# Patient Record
Sex: Male | Born: 1970 | Race: White | Hispanic: No | Marital: Married | Smoking: Never smoker
Health system: Southern US, Community
[De-identification: ages and names within clinical notes are randomized; demographics above are authoritative.]

## PROBLEM LIST (undated history)

## (undated) DIAGNOSIS — K5730 Diverticulosis of large intestine without perforation or abscess without bleeding: Secondary | ICD-10-CM

## (undated) DIAGNOSIS — Z860101 Personal history of adenomatous and serrated colon polyps: Secondary | ICD-10-CM

## (undated) DIAGNOSIS — F419 Anxiety disorder, unspecified: Secondary | ICD-10-CM

## (undated) DIAGNOSIS — R202 Paresthesia of skin: Secondary | ICD-10-CM

## (undated) DIAGNOSIS — Z8489 Family history of other specified conditions: Secondary | ICD-10-CM

## (undated) DIAGNOSIS — K219 Gastro-esophageal reflux disease without esophagitis: Secondary | ICD-10-CM

## (undated) DIAGNOSIS — Z83719 Family history of colon polyps, unspecified: Secondary | ICD-10-CM

## (undated) DIAGNOSIS — Z800 Family history of malignant neoplasm of digestive organs: Secondary | ICD-10-CM

## (undated) DIAGNOSIS — I2510 Atherosclerotic heart disease of native coronary artery without angina pectoris: Secondary | ICD-10-CM

## (undated) DIAGNOSIS — N529 Male erectile dysfunction, unspecified: Secondary | ICD-10-CM

## (undated) HISTORY — DX: Family history of colon polyps, unspecified: Z83.719

## (undated) HISTORY — DX: Gastro-esophageal reflux disease without esophagitis: K21.9

## (undated) HISTORY — DX: Male erectile dysfunction, unspecified: N52.9

## (undated) HISTORY — DX: Personal history of adenomatous and serrated colon polyps: Z86.0101

## (undated) HISTORY — DX: Family history of malignant neoplasm of digestive organs: Z80.0

## (undated) HISTORY — PX: NO PAST SURGERIES: SHX2092

## (undated) HISTORY — DX: Diverticulosis of large intestine without perforation or abscess without bleeding: K57.30

## (undated) HISTORY — DX: Atherosclerotic heart disease of native coronary artery without angina pectoris: I25.10

## (undated) HISTORY — DX: Anxiety disorder, unspecified: F41.9

## (undated) HISTORY — DX: Paresthesia of skin: R20.2

---

## 2005-07-17 ENCOUNTER — Ambulatory Visit: Payer: Self-pay | Admitting: Gastroenterology

## 2018-02-24 DIAGNOSIS — J018 Other acute sinusitis: Secondary | ICD-10-CM | POA: Diagnosis not present

## 2018-05-23 DIAGNOSIS — M545 Low back pain: Secondary | ICD-10-CM | POA: Diagnosis not present

## 2018-06-24 DIAGNOSIS — J301 Allergic rhinitis due to pollen: Secondary | ICD-10-CM | POA: Diagnosis not present

## 2018-06-24 DIAGNOSIS — Z0000 Encounter for general adult medical examination without abnormal findings: Secondary | ICD-10-CM | POA: Diagnosis not present

## 2018-06-24 DIAGNOSIS — K219 Gastro-esophageal reflux disease without esophagitis: Secondary | ICD-10-CM | POA: Diagnosis not present

## 2018-06-24 DIAGNOSIS — Z125 Encounter for screening for malignant neoplasm of prostate: Secondary | ICD-10-CM | POA: Diagnosis not present

## 2018-09-29 DIAGNOSIS — R202 Paresthesia of skin: Secondary | ICD-10-CM | POA: Diagnosis not present

## 2018-09-29 DIAGNOSIS — M549 Dorsalgia, unspecified: Secondary | ICD-10-CM | POA: Diagnosis not present

## 2018-10-04 ENCOUNTER — Other Ambulatory Visit: Payer: Self-pay | Admitting: Internal Medicine

## 2018-10-04 DIAGNOSIS — R202 Paresthesia of skin: Secondary | ICD-10-CM

## 2018-10-05 ENCOUNTER — Other Ambulatory Visit: Payer: Self-pay | Admitting: Internal Medicine

## 2018-10-05 DIAGNOSIS — R202 Paresthesia of skin: Secondary | ICD-10-CM

## 2018-10-15 ENCOUNTER — Ambulatory Visit
Admission: RE | Admit: 2018-10-15 | Discharge: 2018-10-15 | Disposition: A | Discharge status: Home / Self Care | Payer: Self-pay | Source: Ambulatory Visit | Attending: Internal Medicine | Admitting: Internal Medicine

## 2018-10-15 DIAGNOSIS — R202 Paresthesia of skin: Secondary | ICD-10-CM

## 2018-10-15 DIAGNOSIS — M542 Cervicalgia: Secondary | ICD-10-CM | POA: Diagnosis not present

## 2018-10-20 DIAGNOSIS — M5412 Radiculopathy, cervical region: Secondary | ICD-10-CM | POA: Diagnosis not present

## 2020-01-01 ENCOUNTER — Ambulatory Visit: Payer: 59 | Attending: Internal Medicine

## 2020-01-01 DIAGNOSIS — Z20822 Contact with and (suspected) exposure to covid-19: Secondary | ICD-10-CM

## 2020-01-02 LAB — NOVEL CORONAVIRUS, NAA: SARS-CoV-2, NAA: DETECTED — AB

## 2020-02-02 ENCOUNTER — Ambulatory Visit: Payer: 59 | Attending: Internal Medicine

## 2020-02-02 DIAGNOSIS — Z23 Encounter for immunization: Secondary | ICD-10-CM

## 2020-02-02 NOTE — Progress Notes (Signed)
   Covid-19 Vaccination Clinic  Name:  Donald Ramos    MRN: [id number] DOB: 1971/05/22  02/02/2020  [name] was observed post Covid-19 immunization for 15 minutes without incident. He was provided with Vaccine Information Sheet and instruction to access the V-Safe system.   [name] was instructed to call 911 with any severe reactions post vaccine: Marland Kitchen Difficulty breathing  . Swelling of face and throat  . A fast heartbeat  . A bad rash all over body  . Dizziness and weakness   Immunizations Administered    Name Date Dose VIS Date Route   Pfizer COVID-19 Vaccine 02/02/2020 11:36 AM 0.3 mL 10/27/2019 Intramuscular   Manufacturer: [employer], [employer]   Lot: [id number]   NDC: [address]-4

## 2020-02-27 ENCOUNTER — Ambulatory Visit: Payer: 59 | Attending: Internal Medicine

## 2020-02-27 DIAGNOSIS — Z23 Encounter for immunization: Secondary | ICD-10-CM

## 2020-02-27 NOTE — Progress Notes (Signed)
   Covid-19 Vaccination Clinic  Name:  Donald Ramos    MRN: [id number] DOB: [birth year]  02/27/2020  [name] was observed post Covid-19 immunization for 15 minutes without incident. He was provided with Vaccine Information Sheet and instruction to access the V-Safe system.   [name] was instructed to call 911 with any severe reactions post vaccine: Marland Kitchen Difficulty breathing  . Swelling of face and throat  . A fast heartbeat  . A bad rash all over body  . Dizziness and weakness   Immunizations Administered    Name Date Dose VIS Date Route   Pfizer COVID-19 Vaccine 02/27/2020 12:08 PM 0.3 mL 10/27/2019 Intramuscular   Manufacturer: [employer], [employer]   Lot: [id number]   NDC: [address]-7

## 2020-05-23 ENCOUNTER — Ambulatory Visit: Payer: Self-pay

## 2020-05-23 ENCOUNTER — Other Ambulatory Visit: Payer: Self-pay

## 2020-05-23 ENCOUNTER — Encounter: Payer: Self-pay | Admitting: Surgery

## 2020-05-23 ENCOUNTER — Ambulatory Visit (INDEPENDENT_AMBULATORY_CARE_PROVIDER_SITE_OTHER): Payer: 59 | Admitting: Surgery

## 2020-05-23 DIAGNOSIS — M25562 Pain in left knee: Secondary | ICD-10-CM | POA: Diagnosis not present

## 2020-05-23 DIAGNOSIS — G8929 Other chronic pain: Secondary | ICD-10-CM

## 2020-05-23 MED ORDER — LIDOCAINE HCL 1 % IJ SOLN
3.0000 mL | INTRAMUSCULAR | Status: AC | PRN
  Administered 2020-05-23: 3 mL

## 2020-05-23 MED ORDER — METHYLPREDNISOLONE ACETATE 40 MG/ML IJ SUSP
80.0000 mg | INTRAMUSCULAR | Status: AC | PRN
  Administered 2020-05-23: 80 mg via INTRA_ARTICULAR

## 2020-05-23 MED ORDER — BUPIVACAINE HCL 0.25 % IJ SOLN
6.0000 mL | INTRAMUSCULAR | Status: AC | PRN
  Administered 2020-05-23: 6 mL via INTRA_ARTICULAR

## 2020-05-23 NOTE — Progress Notes (Signed)
Office Visit Note   Patient: Donald Ramos           Date of Birth: 1971-04-09           MRN: [id number] Visit Date: 05/23/2020              Requested by: Kirby Funk, MD [address] [place],  [place] [address] PCP: Kirby Funk, MD   Assessment & Plan: Visit Diagnoses:  1. Chronic pain of left knee   2. Mechanical knee pain, left     Plan: In hopes of giving patient relief of his knee pain offered conservative treatment with injection.  After patient consent left knee was prepped with Betadine and intra-articular Marcaine/Depo-Medrol injection was performed.  Tolerated procedure well without complication.  I asked patient to avoid any strenuous activity including his jujitsu training for at least 2 days.  Can resume activity after that.  He will follow-up with me in 2 weeks for recheck and if he has not had any improvement I will plan to schedule left knee MRI to rule out medial and lateral meniscal tear.  I do see patient routinely outside of the office and I will continue to monitor his progress before next office visit.  All questions answered.  Follow-Up Instructions: Return in about 2 weeks (around 06/06/2020) for with [facility] recheck .   Orders:  Orders Placed This Encounter  Procedures  . XR KNEE 3 VIEW LEFT   No orders of the defined types were placed in this encounter.     Procedures: Large Joint Inj on 05/23/2020 11:34 AM Indications: pain Details: 25 G 1.5 in needle, anteromedial approach Medications: 3 mL lidocaine 1 %; 6 mL bupivacaine 0.25 %; 80 mg methylPREDNISolone acetate 40 MG/ML Consent was given by the patient. Patient was prepped and draped in the usual sterile fashion.       Clinical Data: No additional findings.   Subjective: Chief Complaint  Patient presents with  . Left Knee - Pain    HPI 49 year old white male who is new patient to clinic comes in with complaints of left knee pain times a few months.  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  States that in March 2021 he was training and trying to make a transition on the ground he pulled his leg and caused a forceful valgus stress to the left knee.  Since then has been having ongoing pain in the knee when training when he is on the ground and his knee is in a flexed position.  Complains of pain at the medial and lateral joint line.  Some feeling of clicking.  No true locking or feeling of instability.  Feels like at times he has limited range of motion with flexion.  Some swelling.  No problems with his knee before initial injury.  Not currently taking any medication. Review of Systems No current cardiac pulmonary GI GU issues  Objective: Vital Signs: There were no vitals taken for this visit.  Physical Exam HENT:     Head: Normocephalic.  Eyes:     Extraocular Movements: Extraocular movements intact.     Pupils: Pupils are equal, round, and reactive to light.  Pulmonary:     Effort: Pulmonary effort is normal. No respiratory distress.  Musculoskeletal:     Comments: Gait is normal.  Negative logroll bilateral hips.  Left knee he has good range of motion.  May be minimal swelling but no palpable  effusion.  Normal tenderness at the lateral joint line.  Mild discomfort lateral compartment with McMurray's testing.  Medial joint line and plica nontender.  Extensor mechanism intact.  Negative patella apprehension.  Cruciate and collateral ligaments are stable.  Patella tendon nontender.  Nontender over the pes bursa.  Right knee unremarkable.  Bilateral calves nontender.  Neurovascular intact.  Skin:    General: Skin is warm and dry.  Neurological:     General: No focal deficit present.     Mental Status: He is alert and oriented to person, place, and time.  Psychiatric:        Mood and Affect: Mood normal.     Ortho Exam  Specialty Comments:  No specialty comments available.  Imaging: No results found.   PMFS  History: There are no problems to display for this patient.  History reviewed. No pertinent past medical history.  History reviewed. No pertinent family history.  History reviewed. No pertinent surgical history. Social History   Occupational History  . Not on file  Tobacco Use  . Smoking status: Not on file  Substance and Sexual Activity  . Alcohol use: Not on file  . Drug use: Not on file  . Sexual activity: Not on file

## 2020-07-02 ENCOUNTER — Telehealth: Payer: Self-pay | Admitting: Surgery

## 2020-07-02 ENCOUNTER — Other Ambulatory Visit: Payer: Self-pay | Admitting: Surgery

## 2020-07-02 DIAGNOSIS — M25562 Pain in left knee: Secondary | ICD-10-CM

## 2020-07-02 NOTE — Telephone Encounter (Signed)
NOTED

## 2020-07-02 NOTE — Telephone Encounter (Signed)
Yesterday I spoke with patient who is well-known to me outside the office regarding his left knee.  Seen by me in the clinic July 2021 and I performed left knee intra-articular Marcaine/Depo-Medrol injection for pain mechanical symptoms.  States that he did not notice any real improvement of his pain with the injection.  He continues have ongoing symptoms with activities.  He is want to proceed with scheduling of left knee MRI to rule out meniscal tear as we had previously discussed.  I will have him follow-up in the office with Dr. Dorene Grebe to discuss results and further treatment options.

## 2020-07-02 NOTE — Telephone Encounter (Signed)
IC patient and he is aware that he will schedule follow up appt with Dr August Saucer to review study. Will you please make sure it is noted in referral as well?

## 2020-07-02 NOTE — Telephone Encounter (Signed)
Please schedule return office visit with Dr. August Saucer to review left knee MRI and discuss treatment options

## 2020-07-10 ENCOUNTER — Telehealth: Payer: Self-pay | Admitting: Orthopedic Surgery

## 2020-07-10 NOTE — Telephone Encounter (Signed)
Called patient left voicemail message to return call to schedule an appointment for MRI review with Dr. August Saucer   MRI scheduled 07/23/2020

## 2020-07-23 ENCOUNTER — Other Ambulatory Visit: Payer: Self-pay

## 2020-07-23 ENCOUNTER — Ambulatory Visit
Admission: RE | Admit: 2020-07-23 | Discharge: 2020-07-23 | Disposition: A | Discharge status: Home / Self Care | Payer: 59 | Source: Ambulatory Visit | Attending: Surgery | Admitting: Surgery

## 2020-07-23 DIAGNOSIS — M25562 Pain in left knee: Secondary | ICD-10-CM

## 2020-07-29 ENCOUNTER — Ambulatory Visit (INDEPENDENT_AMBULATORY_CARE_PROVIDER_SITE_OTHER): Payer: 59 | Admitting: Orthopedic Surgery

## 2020-07-29 DIAGNOSIS — S83512A Sprain of anterior cruciate ligament of left knee, initial encounter: Secondary | ICD-10-CM

## 2020-07-31 ENCOUNTER — Encounter: Payer: Self-pay | Admitting: Orthopedic Surgery

## 2020-07-31 NOTE — Progress Notes (Signed)
Office Visit Note   Patient: Donald Ramos           Date of Birth: 1971-07-24           MRN: [id number] Visit Date: 07/29/2020 Requested by: Kirby Funk, MD [address] [place],  [place] [address] PCP: Kirby Funk, MD  Subjective: Chief Complaint  Patient presents with  . Knee Pain    HPI: Donald Ramos is a 49 year old patient with left knee pain.  He states that he had a twisting injury in February of this year.  Denies any weakness giving way locking or popping.  He is a Programmer, systems.  Most of his pain is lateral.  Deep knee flexion hurts him.  He likes to train fully for [employer] jujitsu.  He did have an injection done 05/23/2020 without much relief.  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   ROS: All systems reviewed are negative as they relate to the chief complaint within the history of present illness.  Patient denies  fevers or chills.   Assessment & Plan: Visit Diagnoses:  1. Sprain of anterior cruciate ligament of left knee, initial encounter     Plan: Impression is left knee pain in a patient who has partial ACL tear and no other meniscal pathology.  I think in general he is fairly asymptomatic with his ACL.  Having some lateral sided symptoms but no real issues there.  No radiographic or clinical evidence of iliotibial band syndrome.  I think Donald Ramos has excellent muscle strength in his legs and as long as he continues to be able to function without symptomatic instability in his knee no indication for surgery.  However I think it is possible and perhaps even likely that he may need surgery in the future based on the type of demands that precision jujitsu places on the leg.  For now he is doing well.  I recommend gradual return to full activity as he can tolerate it but no surgical indication at this time based both on exam as well as his preference.   Follow-up as needed.  Follow-Up Instructions: Return if symptoms worsen or fail to improve.   Orders:  No orders of the defined types were placed in this encounter.  No orders of the defined types were placed in this encounter.     Procedures: No procedures performed   Clinical Data: No additional findings.  Objective: Vital Signs: There were no vitals taken for this visit.  Physical Exam:   Constitutional: Patient appears well-developed HEENT:  Head: Normocephalic Eyes:EOM are normal Neck: Normal range of motion Cardiovascular: Normal rate Pulmonary/chest: Effort normal Neurologic: Patient is alert Skin: Skin is warm Psychiatric: Patient has normal mood and affect    Ortho Exam: Ortho exam demonstrates full active and passive range of motion of left and right knee.  Left knee has a little bit more laxity than the right knee.  Collaterals are stable.  PCL intact bilaterally.  No posterior lateral rotatory instability bilaterally.  On the left-hand side there is mild lateral joint line tenderness but no tenderness over the iliotibial band and lateral epicondyle.  Specialty Comments:  No specialty comments available.  Imaging: No results found.   PMFS History: There are no problems to display for this patient.  History reviewed. No pertinent past medical history.  History reviewed. No pertinent family history.  History reviewed. No pertinent surgical  history. Social History   Occupational History  . Not on file  Tobacco Use  . Smoking status: Not on file  Substance and Sexual Activity  . Alcohol use: Not on file  . Drug use: Not on file  . Sexual activity: Not on file

## 2020-09-26 IMAGING — MR MR KNEE*L* W/O CM
4 of 7 series · 21 of 40 positions shown · non-contrast
Comparison: X-ray 05/23/2020

CLINICAL DATA: Left knee pain for 6 months after twisting injury

EXAM:
MRI OF THE LEFT KNEE WITHOUT CONTRAST
TECHNIQUE: Multiplanar, multisequence MR imaging of the knee was performed. No
intravenous contrast was administered.

[Series 3: T2 fat-sat · axial · 4.0mm · 0.50mm/px · z∈[-97,+33]mm · 4 of 27 slices shown]
[im 1/27]
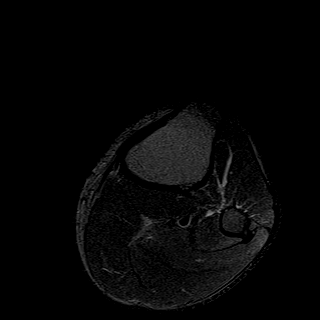
[im 6/27]
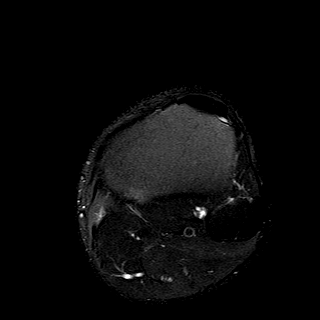
[im 16/27]
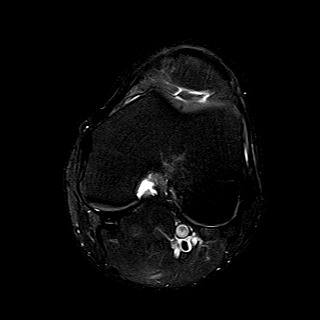
[im 27/27]
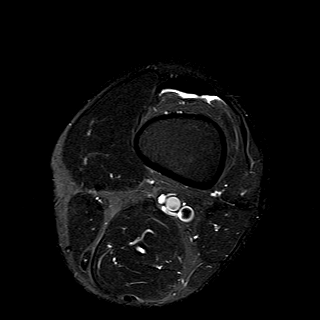

[Series 6: PD fat-sat · coronal · 3.0mm · 0.29mm/px · 7 of 28 slices shown (1 of 3)]
[im 1/28]
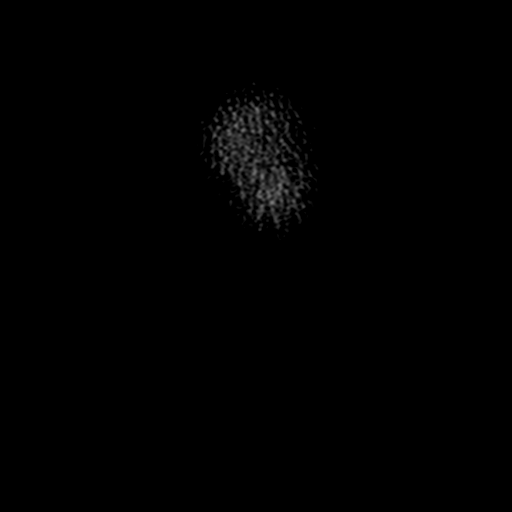
[im 5/28]
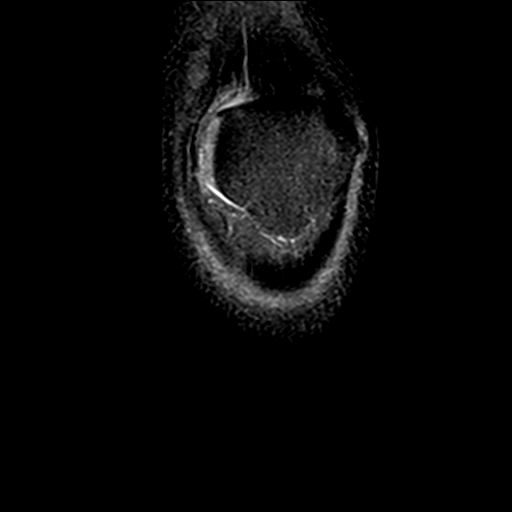
[im 10/28]
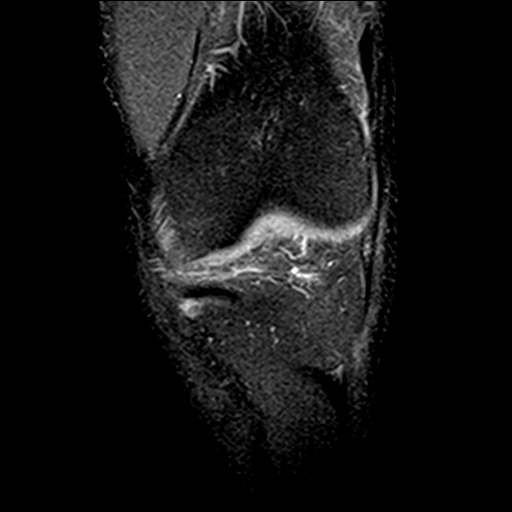
[im 14/28]
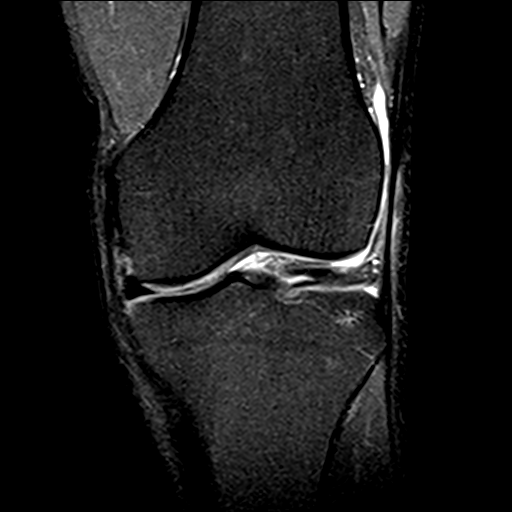
[im 19/28]
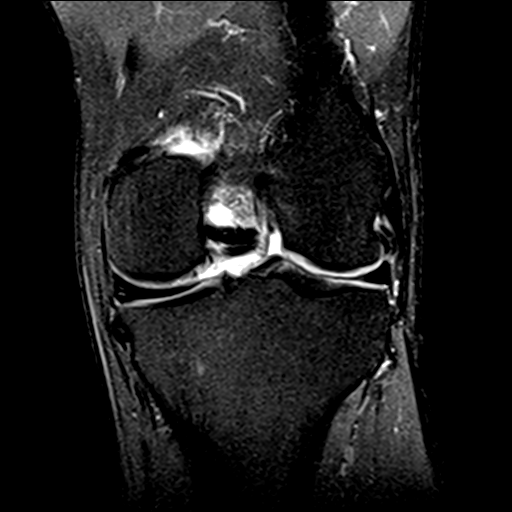
[im 23/28]
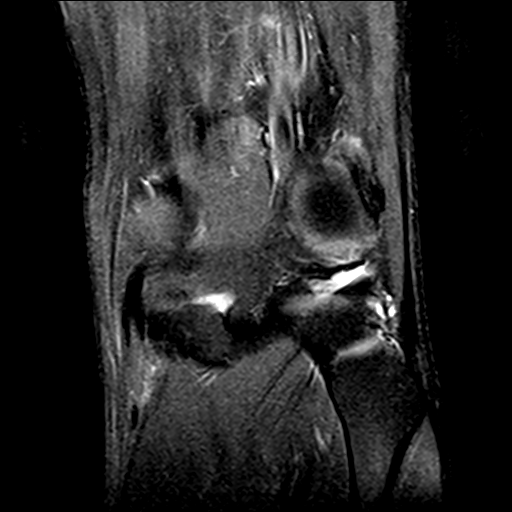
[im 28/28]
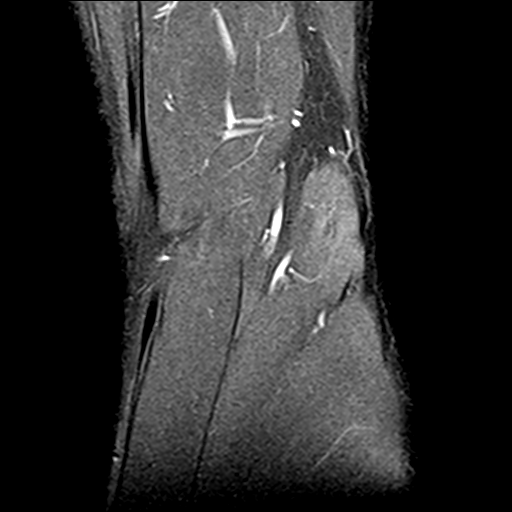

[Series 8: PD fat-sat · sagittal · 3.0mm · 0.29mm/px · 7 of 27 slices shown (2 of 3)]
[im 1/27]
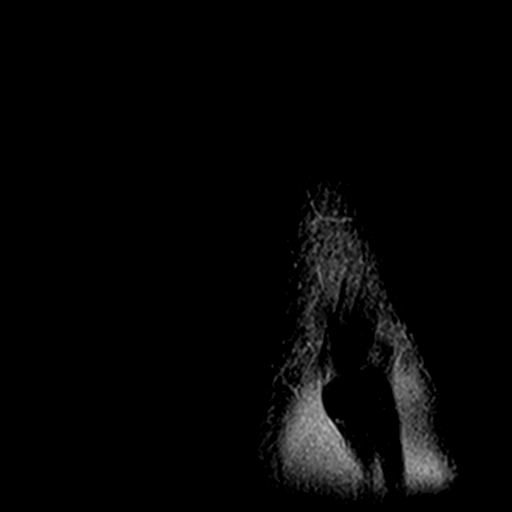
[im 5/27]
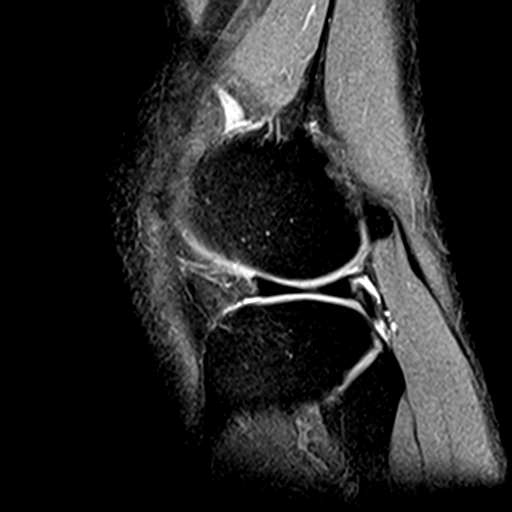
[im 9/27]
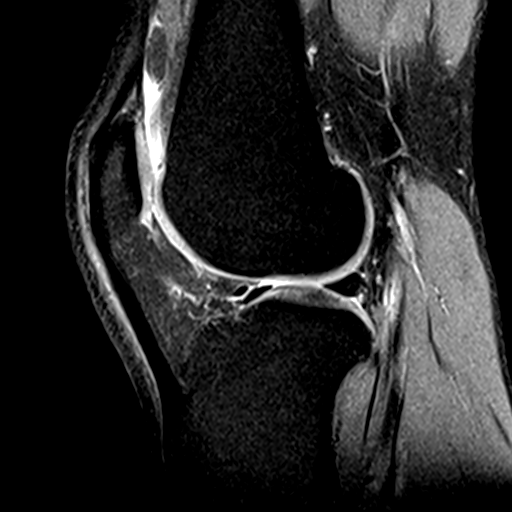
[im 14/27]
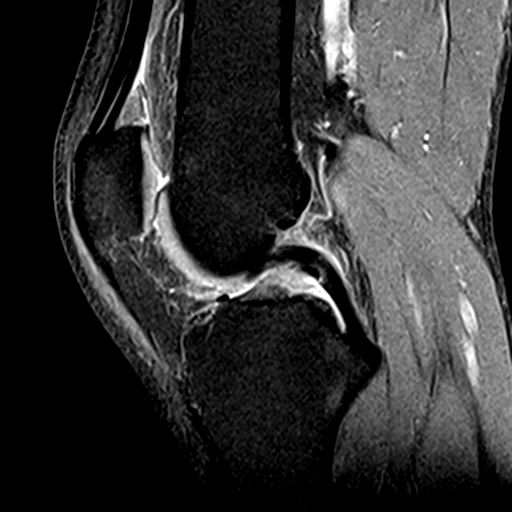
[im 18/27]
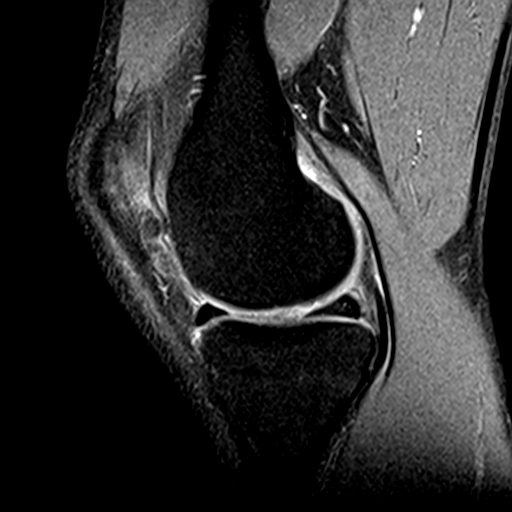
[im 22/27]
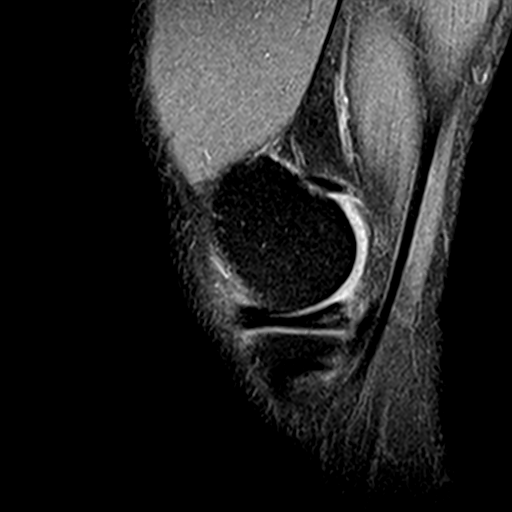
[im 27/27]
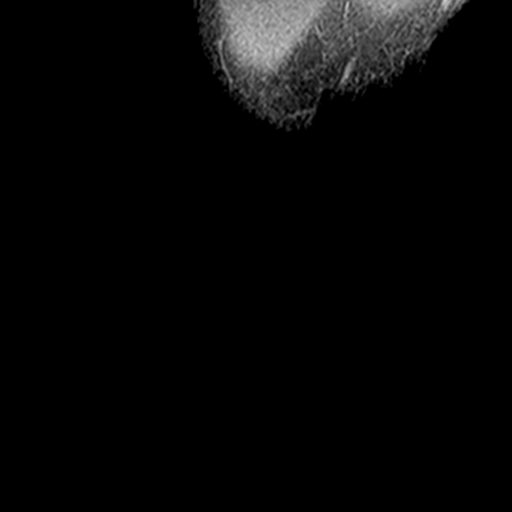

[Series 9: PD fat-sat · oblique · 2.0mm · 0.29mm/px · 3 of 11 slices shown (3 of 3)]
[im 1/11]
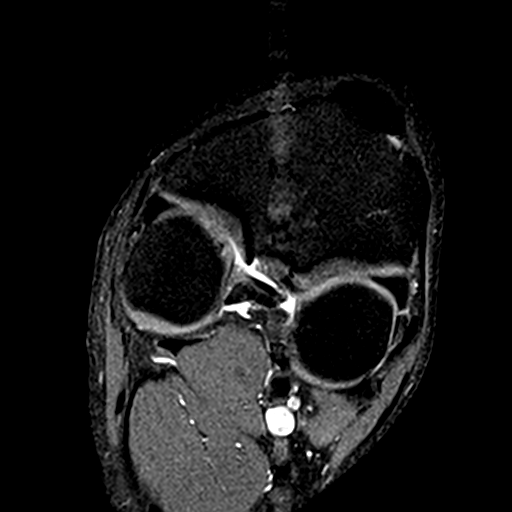
[im 6/11]
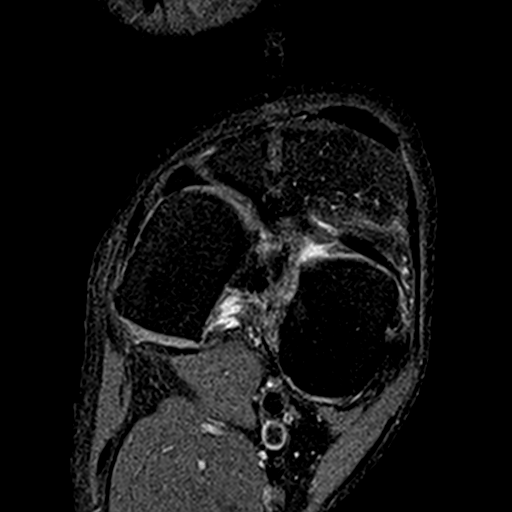
[im 11/11]
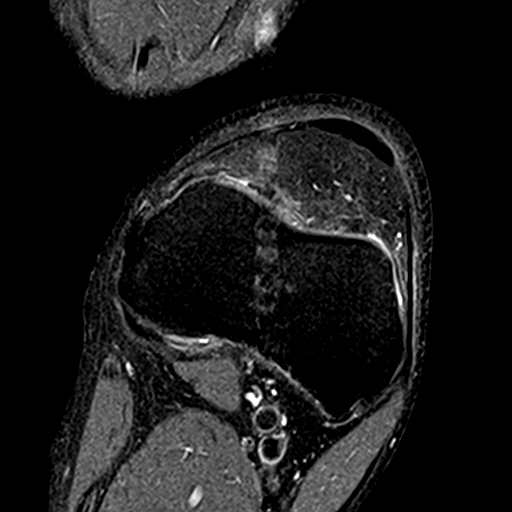

[21 of 40 positions shown; findings below may reference images not displayed]

FINDINGS: MENISCI

Medial meniscus: Mild intermediate intrasubstance signal within the
medial meniscal posterior horn without fluid signal extending to an
articular surface. Findings are favored to reflect intrasubstance
degeneration.

Lateral meniscus:  Intact.

LIGAMENTS

Cruciates: Chronic appearing partial tear of the mid substance of
the anterior cruciate ligament (series 8, image 12; series 6, images
11-12). A portion of the ACL fibers do remain intact. There is no
intrasubstance or periligamentous edema to suggest an acute or
subacute injury. PCL intact.

Collaterals: Medial collateral ligament is intact. Lateral
collateral ligament complex is intact.

CARTILAGE

Patellofemoral:  No chondral defect.

Medial: Mild partial-thickness chondral surface irregularity along
the more lateral aspect of the weight-bearing medial femoral
condyle. No full-thickness cartilage defect.

Lateral:  No chondral defect.

Joint:  No joint effusion.  Fat pads within normal limits.

Popliteal Fossa:  No Baker cyst. Intact popliteus tendon.

Extensor Mechanism:  Intact quadriceps tendon and patellar tendon.

Bones: No focal marrow signal abnormality. No fracture or
dislocation.

Other: None.
IMPRESSION: 1. Chronic-appearing partial tear of the ACL. No intrasubstance or
periligamentous edema to suggest an acute or subacute injury.
2. Intrasubstance degeneration of the medial meniscus without
discrete tear.
3. Mild partial-thickness chondral surface irregularity of the
weight-bearing medial femoral condyle.

## 2020-11-19 DIAGNOSIS — Z1211 Encounter for screening for malignant neoplasm of colon: Secondary | ICD-10-CM | POA: Diagnosis not present

## 2020-11-19 DIAGNOSIS — Z01818 Encounter for other preprocedural examination: Secondary | ICD-10-CM | POA: Diagnosis not present

## 2021-01-02 DIAGNOSIS — L600 Ingrowing nail: Secondary | ICD-10-CM | POA: Diagnosis not present

## 2021-01-02 DIAGNOSIS — M25775 Osteophyte, left foot: Secondary | ICD-10-CM | POA: Diagnosis not present

## 2021-01-24 DIAGNOSIS — Z01812 Encounter for preprocedural laboratory examination: Secondary | ICD-10-CM | POA: Diagnosis not present

## 2021-01-29 DIAGNOSIS — Z1211 Encounter for screening for malignant neoplasm of colon: Secondary | ICD-10-CM | POA: Diagnosis not present

## 2021-01-29 DIAGNOSIS — D123 Benign neoplasm of transverse colon: Secondary | ICD-10-CM | POA: Diagnosis not present

## 2021-01-29 DIAGNOSIS — D122 Benign neoplasm of ascending colon: Secondary | ICD-10-CM | POA: Diagnosis not present

## 2021-01-29 DIAGNOSIS — Z8371 Family history of colonic polyps: Secondary | ICD-10-CM | POA: Diagnosis not present

## 2021-01-29 DIAGNOSIS — K5730 Diverticulosis of large intestine without perforation or abscess without bleeding: Secondary | ICD-10-CM | POA: Diagnosis not present

## 2021-01-29 DIAGNOSIS — K635 Polyp of colon: Secondary | ICD-10-CM | POA: Diagnosis not present

## 2021-09-26 ENCOUNTER — Other Ambulatory Visit: Payer: Self-pay | Admitting: Internal Medicine

## 2021-09-26 DIAGNOSIS — R1909 Other intra-abdominal and pelvic swelling, mass and lump: Secondary | ICD-10-CM

## 2021-10-02 ENCOUNTER — Ambulatory Visit (HOSPITAL_COMMUNITY)
Admission: RE | Admit: 2021-10-02 | Discharge: 2021-10-02 | Disposition: A | Discharge status: Home / Self Care | Payer: BC Managed Care – PPO | Source: Ambulatory Visit | Attending: Internal Medicine | Admitting: Internal Medicine

## 2021-10-02 ENCOUNTER — Other Ambulatory Visit: Payer: Self-pay

## 2021-10-02 DIAGNOSIS — R1909 Other intra-abdominal and pelvic swelling, mass and lump: Secondary | ICD-10-CM | POA: Insufficient documentation

## 2021-10-03 ENCOUNTER — Other Ambulatory Visit: Payer: Self-pay | Admitting: Internal Medicine

## 2021-10-03 ENCOUNTER — Ambulatory Visit
Admission: RE | Admit: 2021-10-03 | Discharge: 2021-10-03 | Disposition: A | Discharge status: Home / Self Care | Payer: BC Managed Care – PPO | Source: Ambulatory Visit | Attending: Internal Medicine | Admitting: Internal Medicine

## 2021-10-03 DIAGNOSIS — R509 Fever, unspecified: Secondary | ICD-10-CM | POA: Diagnosis not present

## 2021-10-03 DIAGNOSIS — R590 Localized enlarged lymph nodes: Secondary | ICD-10-CM | POA: Diagnosis not present

## 2021-10-03 DIAGNOSIS — Z03818 Encounter for observation for suspected exposure to other biological agents ruled out: Secondary | ICD-10-CM | POA: Diagnosis not present

## 2021-10-06 ENCOUNTER — Ambulatory Visit (HOSPITAL_COMMUNITY): Payer: 59

## 2021-11-06 DIAGNOSIS — R591 Generalized enlarged lymph nodes: Secondary | ICD-10-CM | POA: Diagnosis not present

## 2021-11-11 ENCOUNTER — Other Ambulatory Visit: Payer: Self-pay | Admitting: Internal Medicine

## 2021-11-11 DIAGNOSIS — R590 Localized enlarged lymph nodes: Secondary | ICD-10-CM

## 2021-12-05 ENCOUNTER — Ambulatory Visit
Admission: RE | Admit: 2021-12-05 | Discharge: 2021-12-05 | Disposition: A | Discharge status: Home / Self Care | Payer: BC Managed Care – PPO | Source: Ambulatory Visit | Attending: Internal Medicine | Admitting: Internal Medicine

## 2021-12-05 ENCOUNTER — Other Ambulatory Visit: Payer: BC Managed Care – PPO

## 2021-12-05 DIAGNOSIS — R590 Localized enlarged lymph nodes: Secondary | ICD-10-CM

## 2021-12-05 DIAGNOSIS — R599 Enlarged lymph nodes, unspecified: Secondary | ICD-10-CM | POA: Diagnosis not present

## 2021-12-05 MED ORDER — IOPAMIDOL (ISOVUE-300) INJECTION 61%
100.0000 mL | Freq: Once | INTRAVENOUS | Status: AC | PRN
  Administered 2021-12-05: 100 mL via INTRAVENOUS

## 2021-12-06 IMAGING — US US PELVIS LIMITED
1 series · 14 of 25 positions shown · non-contrast
Comparison: None.

CLINICAL DATA: Right groin mass

EXAM:
LIMITED ULTRASOUND OF PELVIS
TECHNIQUE: Limited transabdominal ultrasound examination of the pelvis was
performed.

[Series 1: us pelvis limited · 35 acquisitions, 14 frames shown]
[im 1/35]
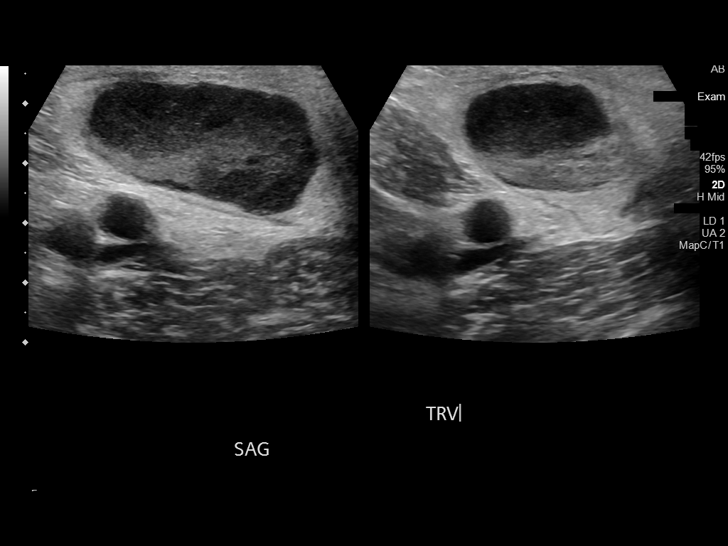
[im 3/35]
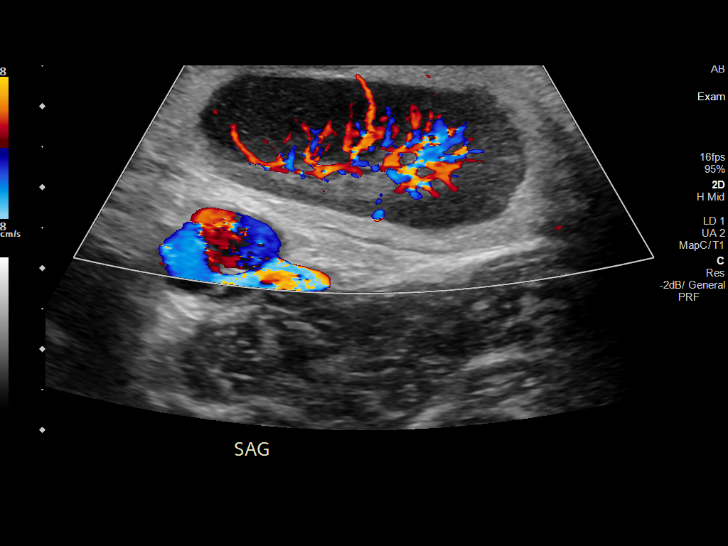
[im 6/35]
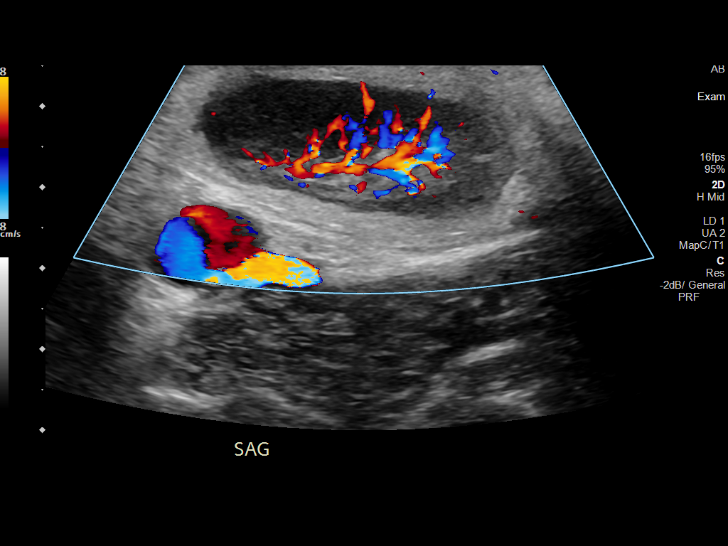
[im 9/35]
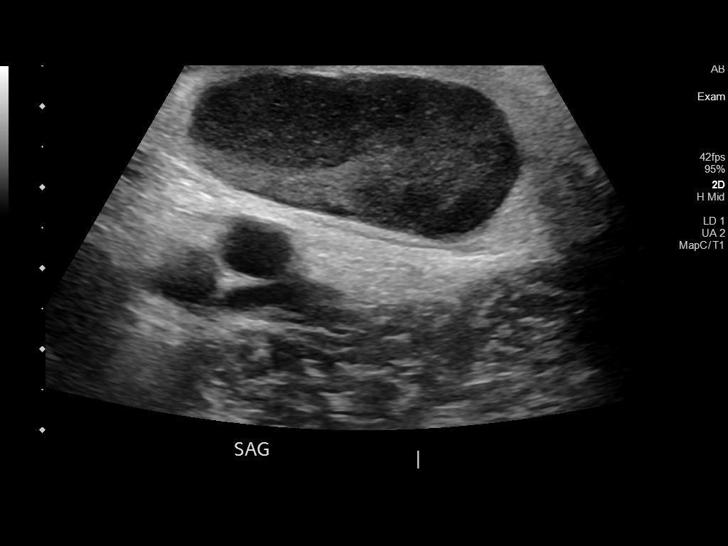
[im 12/35]
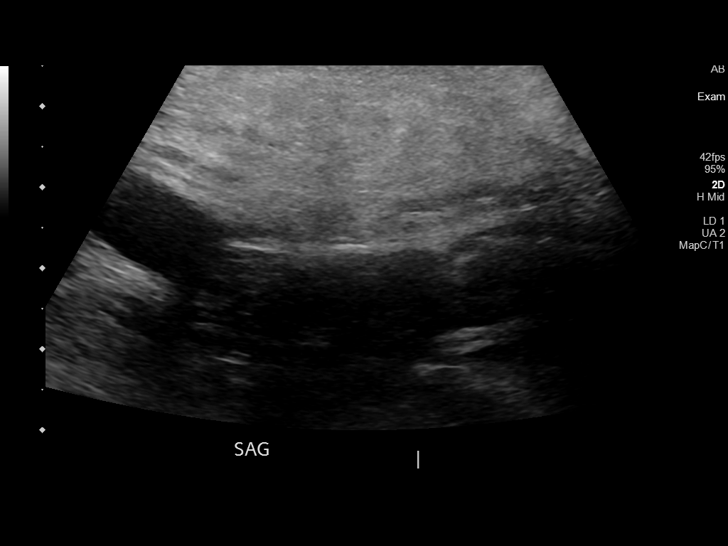
[im 13/35]
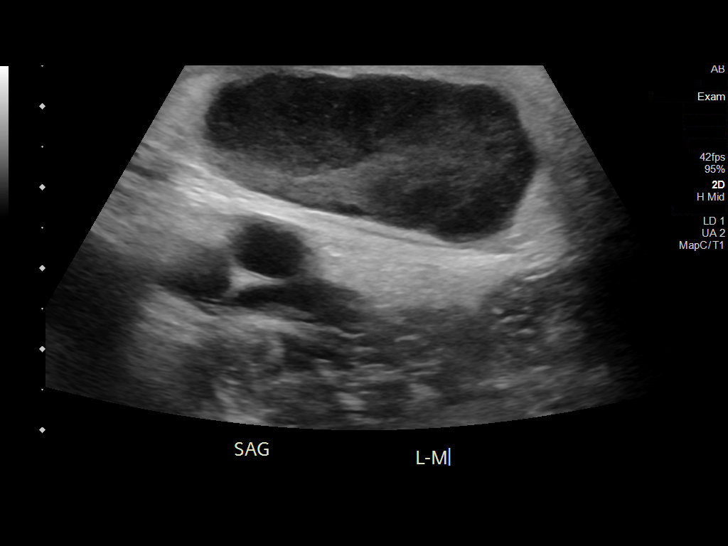
[im 16/35]
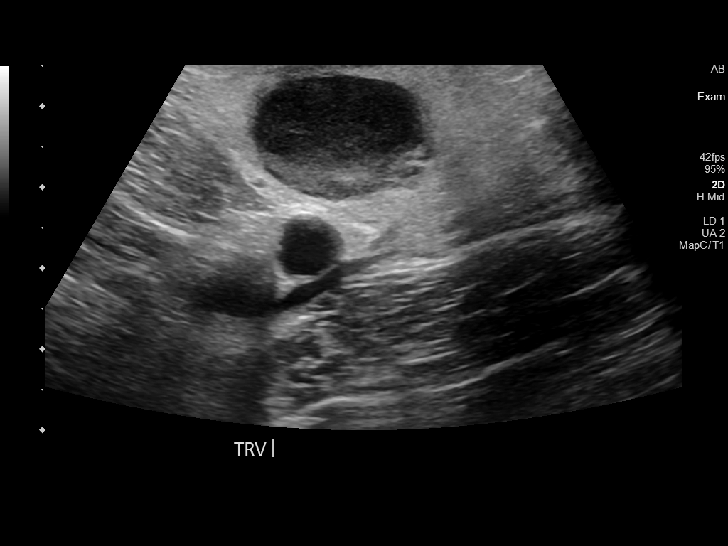
[im 19/35]
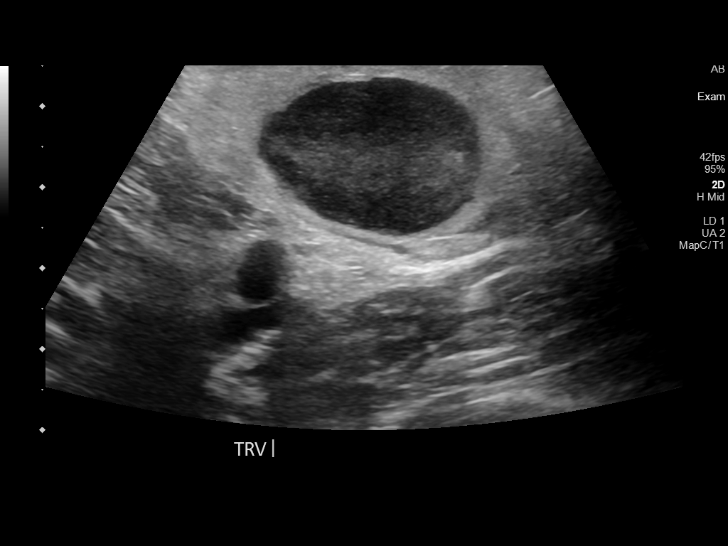
[im 22/35]
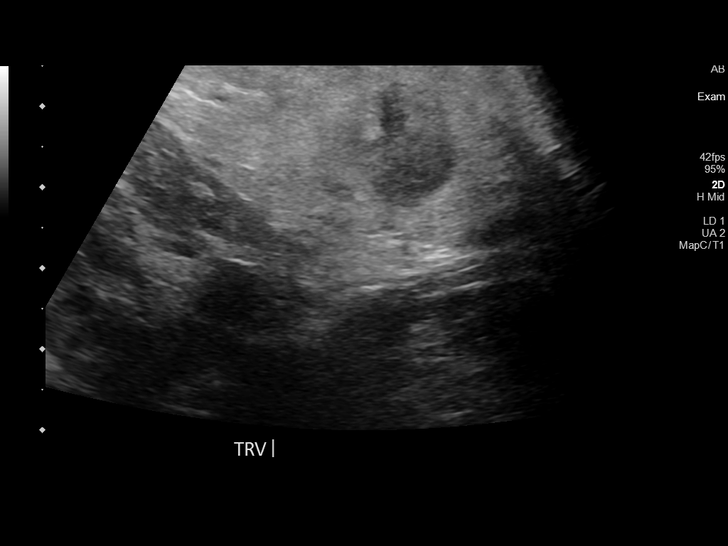
[im 23/35]
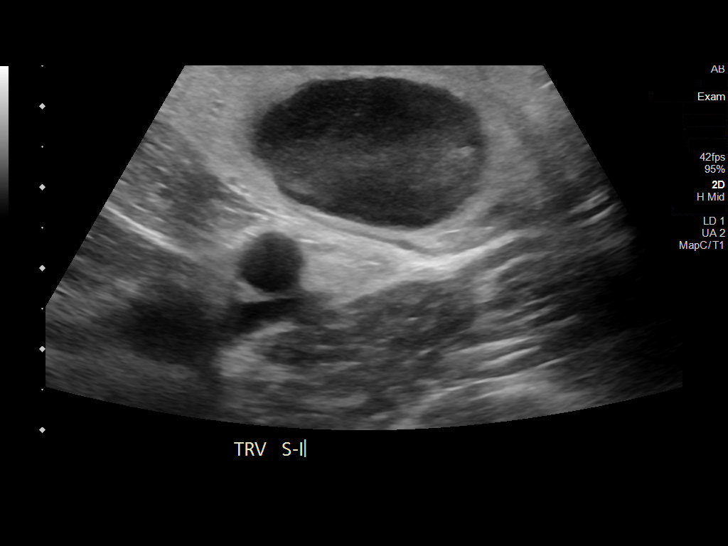
[im 26/35]
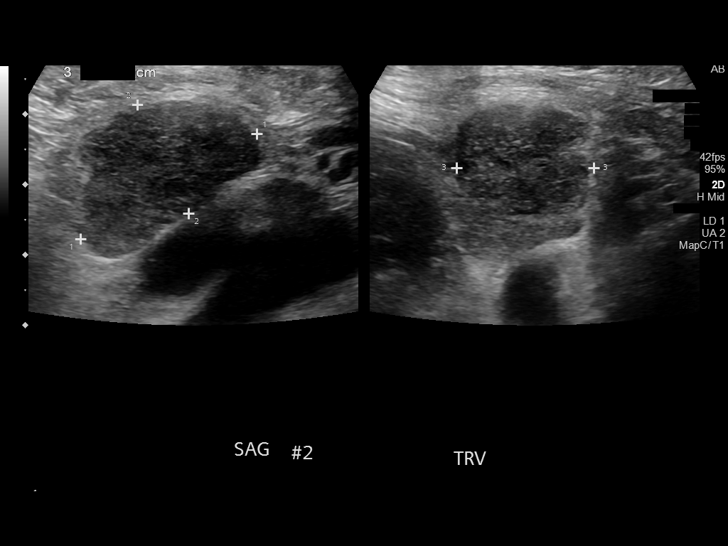
[im 29/35]
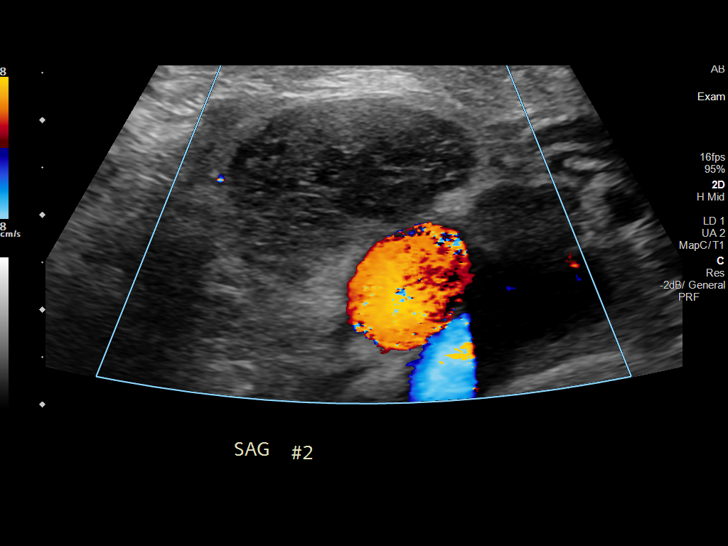
[im 32/35]
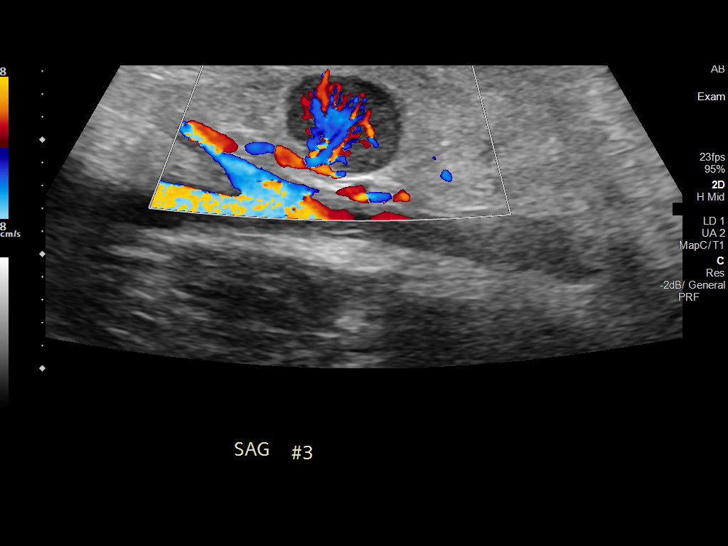
[im 35/35]
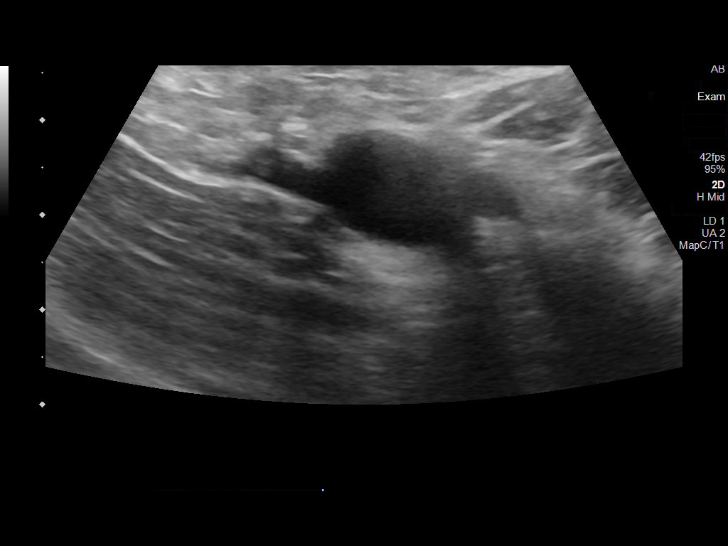

[14 of 25 positions shown; findings below may reference images not displayed]

FINDINGS: Sonographic examination of right inguinal region was done. There is
3.9 x 1.9 x 2.5 cm smooth marginated mixed echogenic nodule with
increased vascularity in the hilum. There is 2.9 x 1.7 cm 2 cm
nodule with inhomogeneous echogenicity without demonstrable hilum.
There is 1.1 x 0.9 x 1 cm round nodule with vascularity in the
hilum. There are no abnormal loculated fluid collections. There are
no demonstrable nodules in the left inguinal region.
IMPRESSION: There are multiple smooth marginated nodules of varying sizes in the
subcutaneous plane in the right inguinal region. Findings suggest
reactive hyperplasia of lymph nodes are active inflammatory or
neoplastic process. Continued clinical observation and biopsy as
warranted should be considered.

## 2022-02-08 IMAGING — CT CT PELVIS W/ CM
1 series · 15 of 32 positions shown, 19 images · IV contrast (APPLIED)
Comparison: None.

CLINICAL DATA: Follow-up adenopathy seen in the right groin seen on
a pelvic ultrasound dated October 02, 2021.

EXAM:
CT PELVIS WITH CONTRAST
TECHNIQUE: Multidetector CT imaging of the pelvis was performed using the
standard protocol following the bolus administration of intravenous
contrast.

[Series 2: routine pelvis w/cm · axial · 0.78mm/px · z∈[-457,-212]mm · 15 of 55 slices shown, 19 images]
[im 4/55  soft-tissue]
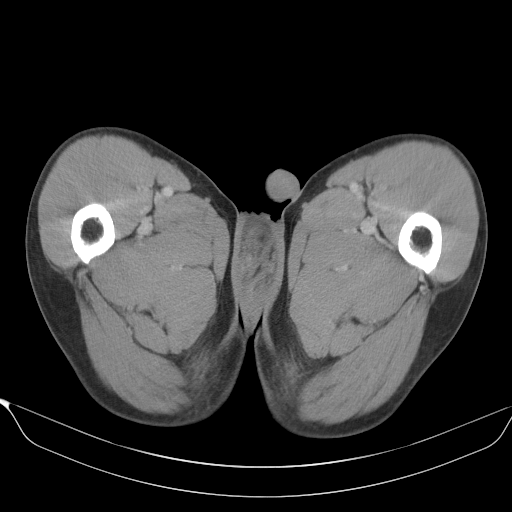
[im 4/55  bone]
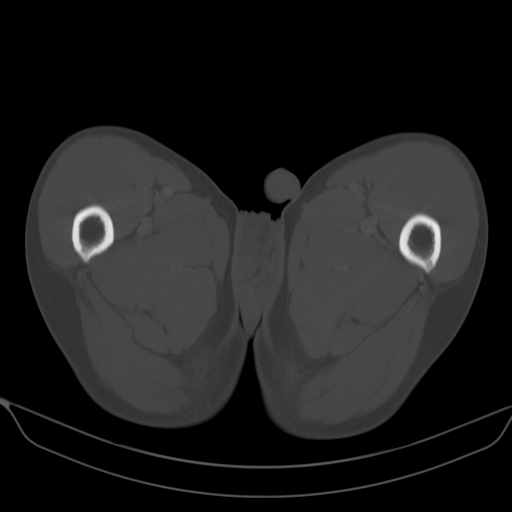
[im 7/55  soft-tissue]
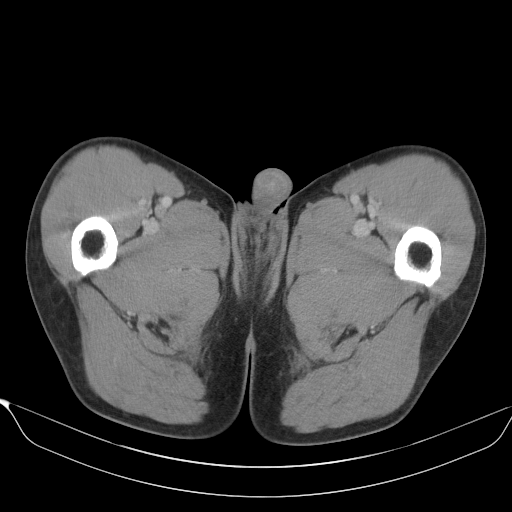
[im 11/55  soft-tissue]
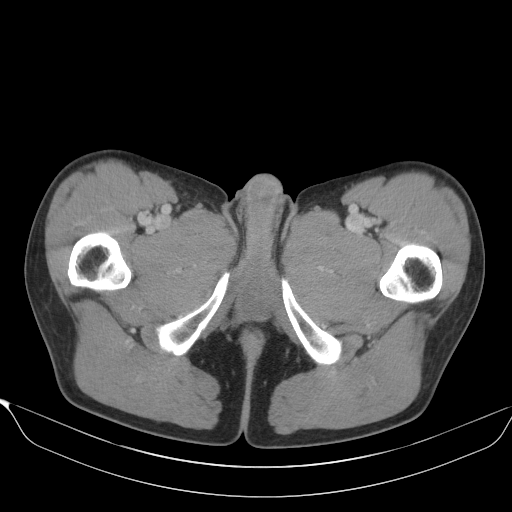
[im 16/55  soft-tissue]
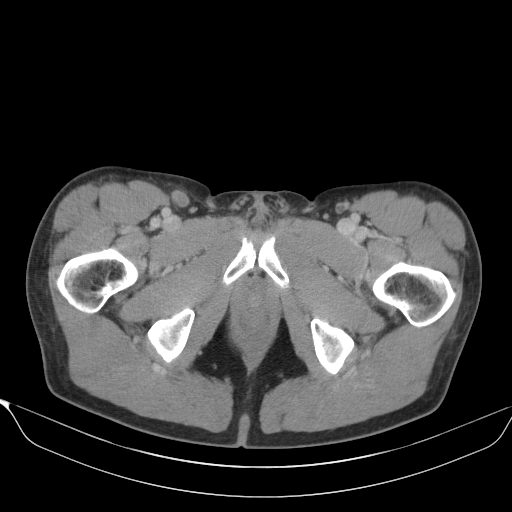
[im 20/55  soft-tissue]
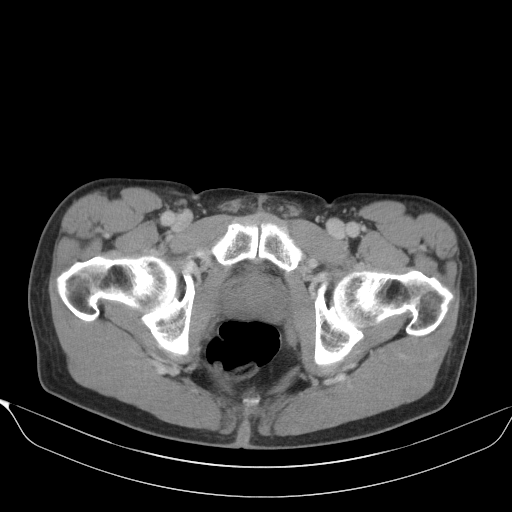
[im 23/55  soft-tissue]
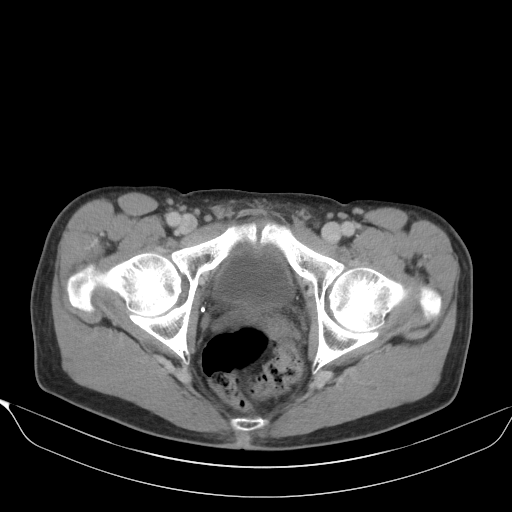
[im 28/55  soft-tissue]
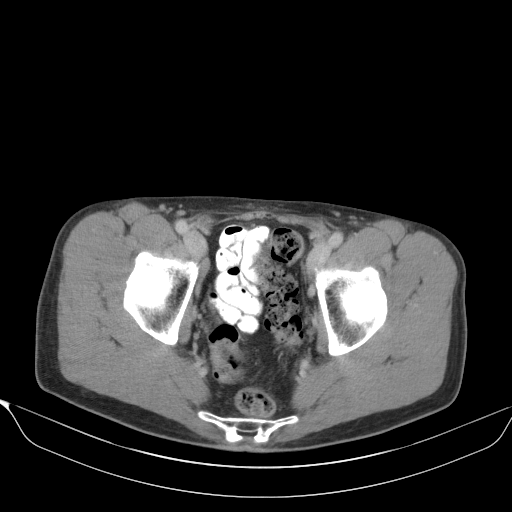
[im 32/55  soft-tissue]
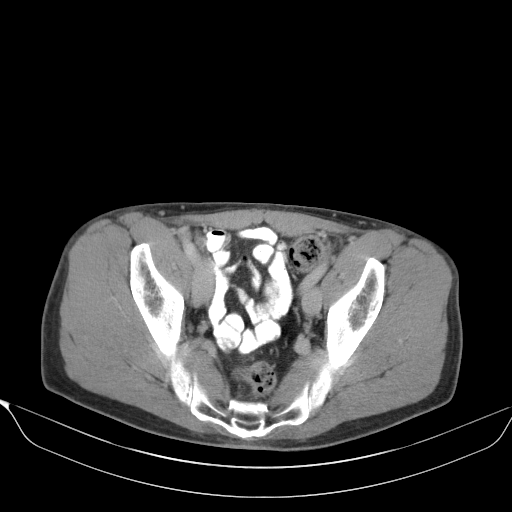
[im 35/55  soft-tissue]
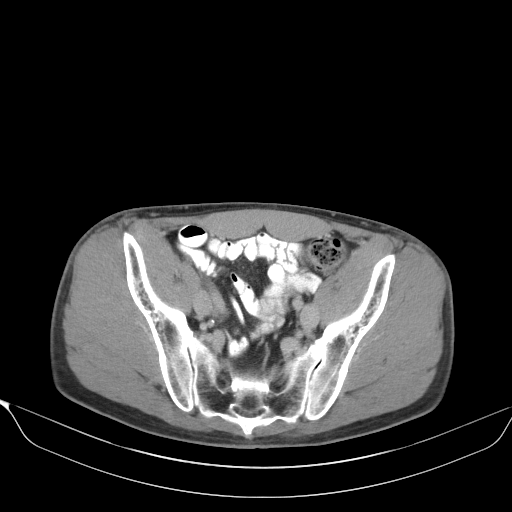
[im 35/55  bone]
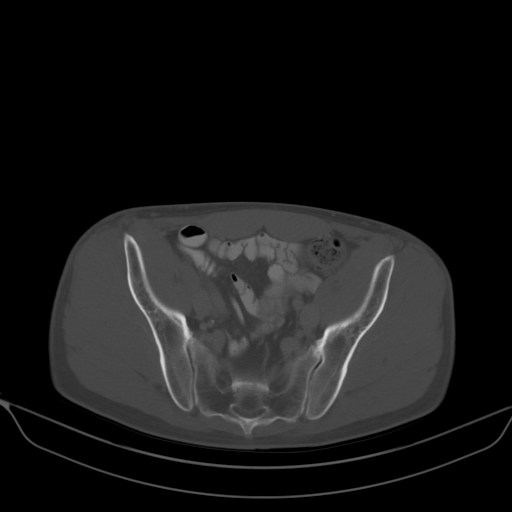
[im 39/55  soft-tissue]
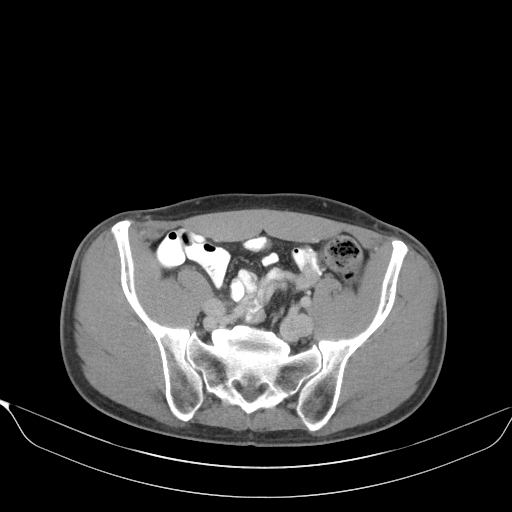
[im 44/55  soft-tissue]
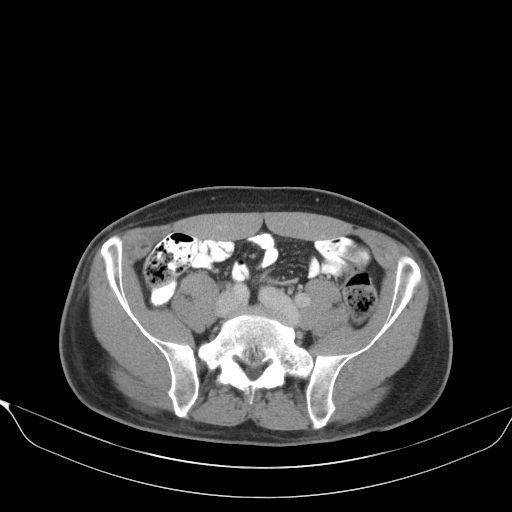
[im 48/55  soft-tissue]
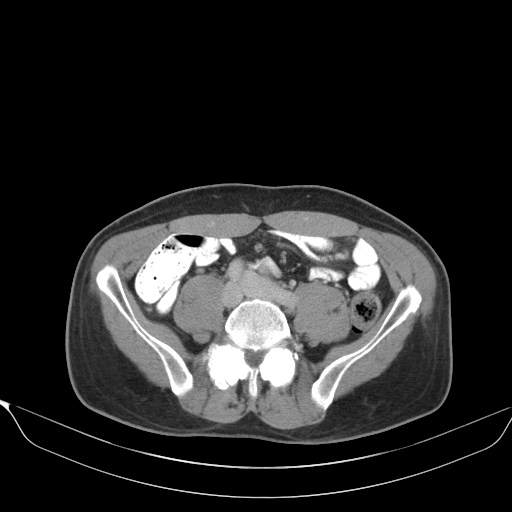
[im 48/55  lung]
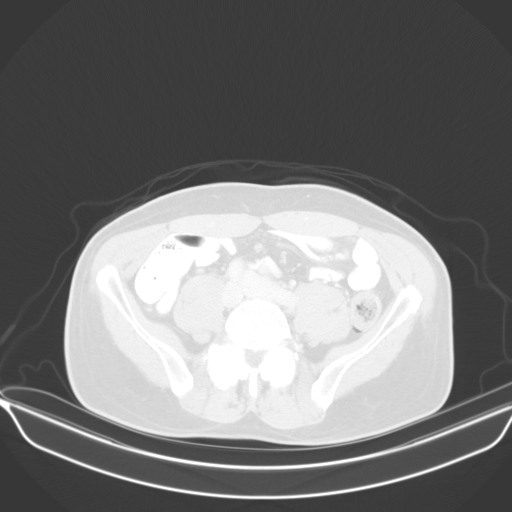
[im 49/55  lung]
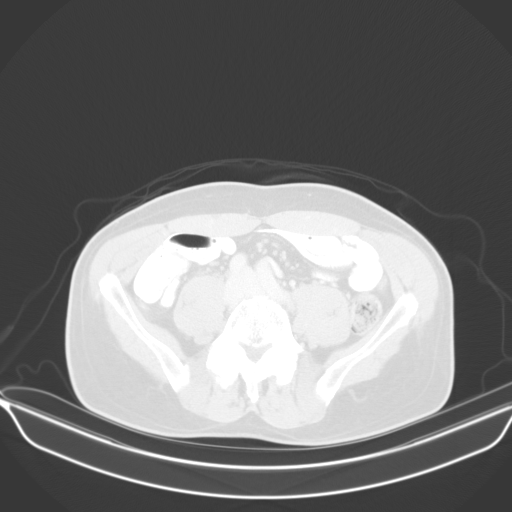
[im 51/55  soft-tissue]
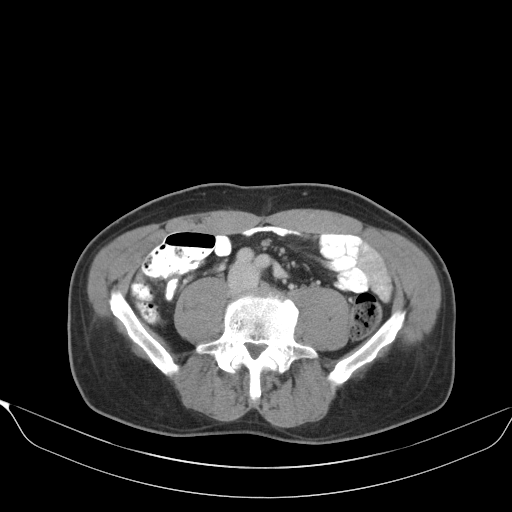
[im 51/55  lung]
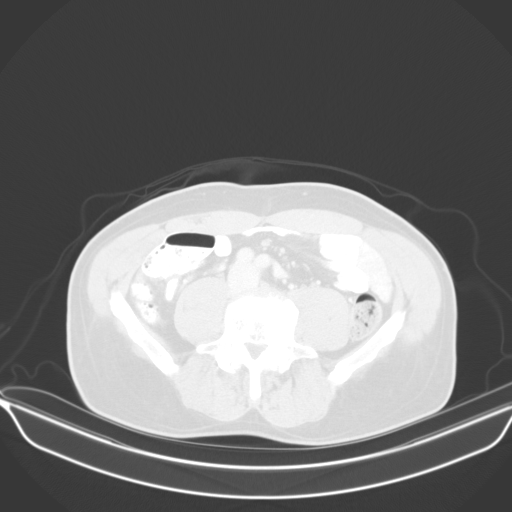
[im 53/55  lung]
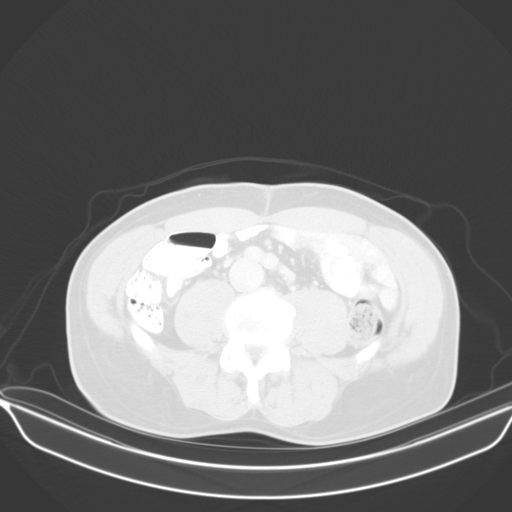

[15 of 32 positions shown; findings below may reference images not displayed]

RADIATION DOSE REDUCTION: This exam was performed according to the
departmental dose-optimization program which includes automated
exposure control, adjustment of the mA and/or kV according to
patient size and/or use of iterative reconstruction technique.

CONTRAST:  100mL [id number]-TEE IOPAMIDOL (0T06KT-TEE) INJECTION 61%
FINDINGS: Urinary Tract:  No abnormality visualized.

Bowel:  Unremarkable visualized pelvic bowel loops.

Vascular/Lymphatic: The visualized aorta, iliac arteries, iliac
veins, and IVC are normal. The adenopathy seen in the right groin on
the comparison ultrasound has resolved. The largest lymph node in
the right inguinal region demonstrates a short axis measurement of 9
mm, within normal limits. No adenopathy identified in either
inguinal region or within the pelvis.

Reproductive:  No mass or other significant abnormality

Other:  None.

Musculoskeletal: No suspicious bone lesions identified.
IMPRESSION: 1. No adenopathy identified. The previous right groin adenopathy has
resolved.

## 2022-07-02 DIAGNOSIS — Z125 Encounter for screening for malignant neoplasm of prostate: Secondary | ICD-10-CM | POA: Diagnosis not present

## 2022-07-02 DIAGNOSIS — Z8249 Family history of ischemic heart disease and other diseases of the circulatory system: Secondary | ICD-10-CM | POA: Diagnosis not present

## 2022-07-02 DIAGNOSIS — E7800 Pure hypercholesterolemia, unspecified: Secondary | ICD-10-CM | POA: Diagnosis not present

## 2022-07-02 DIAGNOSIS — Z23 Encounter for immunization: Secondary | ICD-10-CM | POA: Diagnosis not present

## 2022-07-02 DIAGNOSIS — Z0000 Encounter for general adult medical examination without abnormal findings: Secondary | ICD-10-CM | POA: Diagnosis not present

## 2022-07-02 DIAGNOSIS — J301 Allergic rhinitis due to pollen: Secondary | ICD-10-CM | POA: Diagnosis not present

## 2022-07-02 DIAGNOSIS — Z86010 Personal history of colonic polyps: Secondary | ICD-10-CM | POA: Diagnosis not present

## 2022-07-02 DIAGNOSIS — N529 Male erectile dysfunction, unspecified: Secondary | ICD-10-CM | POA: Diagnosis not present

## 2022-09-30 DIAGNOSIS — Z23 Encounter for immunization: Secondary | ICD-10-CM | POA: Diagnosis not present

## 2023-07-01 DIAGNOSIS — M25562 Pain in left knee: Secondary | ICD-10-CM | POA: Diagnosis not present

## 2023-07-04 ENCOUNTER — Emergency Department (HOSPITAL_COMMUNITY): Payer: BC Managed Care – PPO

## 2023-07-04 ENCOUNTER — Emergency Department (HOSPITAL_COMMUNITY)
Admission: EM | Admit: 2023-07-04 | Discharge: 2023-07-04 | Disposition: A | Discharge status: Home / Self Care | Payer: BC Managed Care – PPO | Source: Home / Self Care | Attending: Emergency Medicine | Admitting: Emergency Medicine

## 2023-07-04 ENCOUNTER — Encounter (HOSPITAL_COMMUNITY): Payer: Self-pay

## 2023-07-04 DIAGNOSIS — M79662 Pain in left lower leg: Secondary | ICD-10-CM | POA: Diagnosis not present

## 2023-07-04 DIAGNOSIS — M25562 Pain in left knee: Secondary | ICD-10-CM | POA: Diagnosis not present

## 2023-07-04 NOTE — Discharge Instructions (Signed)
Return for any problem.   Follow up closely tomorrow with [facility].

## 2023-07-04 NOTE — ED Triage Notes (Signed)
Pt arrived via POV, c/o left leg, from knee down swelling. Was recently seen for knee pain at [facility], joint drained. Pain and swelling since. States pain has been lessening but swelling continues.

## 2023-07-04 NOTE — Progress Notes (Signed)
LLE venous duplex has been completed.  Preliminary results given to Dr. Rodena Medin.   Results can be found under chart review under CV PROC. 07/04/2023 5:06 PM Gabbriella Presswood RVT, RDMS

## 2023-07-04 NOTE — ED Provider Notes (Signed)
Emergency Department Provider Note   I have reviewed the triage vital signs and the nursing notes.   HISTORY  Chief Complaint Leg Swelling   HPI Donald Ramos is a 52 y.o. male presents to the emergency department with left leg swelling.  He began with outpatient orthopedics ([facility]) for atraumatic left knee pain with swelling.  No fevers.  He had fluid drawn from the knee last week and is awaiting results.  States actually the pain is gotten better.  He is moving the knee without difficulty but does notice some increased swelling both to the knee and leg in general.    History reviewed. No pertinent past medical history.  Review of Systems  Constitutional: No fever/chills Cardiovascular: Denies chest pain. Respiratory: Denies shortness of breath. Gastrointestinal: No abdominal pain.  Genitourinary: Negative for dysuria. Musculoskeletal: leg swelling/pain.  Skin: Negative for rash. Neurological: Negative for headaches.  ____________________________________________   PHYSICAL EXAM:  VITAL SIGNS: ED Triage Vitals [07/04/23 1032]  Encounter Vitals Group     BP (!) 146/93     Pulse Rate 70     Resp 14     Temp 98.3 F (36.8 C)     Temp Source Oral     SpO2 100 %     Weight 160 lb (72.6 kg)     Height 5\' 10"  (1.778 m)   Constitutional: Alert and oriented. Well appearing and in no acute distress. Eyes: Conjunctivae are normal.  Head: Atraumatic. Nose: No congestion/rhinnorhea. Mouth/Throat: Mucous membranes are moist. Neck: No stridor.   Cardiovascular: Normal rate, regular rhythm. Good peripheral circulation. Grossly normal heart sounds.   Respiratory: Normal respiratory effort.   Gastrointestinal: No distention.  Musculoskeletal: Swelling with mild erythema to the left knee with some associated left calf swelling.  Normal range of motion of the left knee without significant difficulty.  Neurologic:  Normal speech and language.  Skin:  Skin is warm, dry and  intact. No rash noted.  ____________________________________________  RADIOLOGY  VAS [place] LOWER EXTREMITY VENOUS (DVT) (7a-7p)  Result Date: 07/04/2023  Lower Venous DVT Study Patient Name:  Donald Ramos [facility]  Date of Exam:   07/04/2023 Medical Rec #: [id number]      Accession #:    [id number] Date of Birth: 10-20-1971       Patient Gender: M Patient Age:   75 years Exam Location:  [facility] Procedure:      VAS [place] LOWER EXTREMITY VENOUS (DVT) Referring Phys: Symphanie Cederberg --------------------------------------------------------------------------------  Indications: Swelling, and Erythema. Other Indications: Recently had fuid drained from knee and has had pain and                    swelling since. Comparison Study: No previous exams Performing Technologist: Jody Hill RVT, RDMS  Examination Guidelines: A complete evaluation includes B-mode imaging, spectral Doppler, color Doppler, and power Doppler as needed of all accessible portions of each vessel. Bilateral testing is considered an integral part of a complete examination. Limited examinations for reoccurring indications may be performed as noted. The reflux portion of the exam is performed with the patient in reverse Trendelenburg.  +-----+---------------+---------+-----------+----------+--------------+ RIGHTCompressibilityPhasicitySpontaneityPropertiesThrombus Aging +-----+---------------+---------+-----------+----------+--------------+ CFV  Full           Yes      Yes                                 +-----+---------------+---------+-----------+----------+--------------+   +---------+---------------+---------+-----------+----------+--------------+  LEFT     CompressibilityPhasicitySpontaneityPropertiesThrombus Aging +---------+---------------+---------+-----------+----------+--------------+ CFV      Full           Yes      Yes                                  +---------+---------------+---------+-----------+----------+--------------+ SFJ      Full                                                        +---------+---------------+---------+-----------+----------+--------------+ FV Prox  Full           Yes      Yes                                 +---------+---------------+---------+-----------+----------+--------------+ FV Mid   Full           Yes      Yes                                 +---------+---------------+---------+-----------+----------+--------------+ FV DistalFull           Yes      Yes                                 +---------+---------------+---------+-----------+----------+--------------+ PFV      Full                                                        +---------+---------------+---------+-----------+----------+--------------+ POP      Full           Yes      Yes                                 +---------+---------------+---------+-----------+----------+--------------+ PTV      Full                                                        +---------+---------------+---------+-----------+----------+--------------+ PERO     Full                                                        +---------+---------------+---------+-----------+----------+--------------+     Summary: RIGHT: - No evidence of common femoral vein obstruction. - Ultrasound characteristics of mildly enlarged lymph nodes are noted in the groin.  LEFT: - There is no evidence of deep vein thrombosis in the lower extremity.  - No cystic structure found in the popliteal fossa. - Ultrasound characteristics of mildly enlarged lymph nodes noted in the groin. subcutaneous  edema seen in area of knee and calf.  *See table(s) above for measurements and observations.    Preliminary     ____________________________________________   PROCEDURES  Procedure(s) performed:   Procedures  None   ____________________________________________   INITIAL IMPRESSION / ASSESSMENT AND PLAN / ED COURSE  Pertinent labs & imaging results that were available during my care of the patient were reviewed by me and considered in my medical decision making (see chart for details).   This patient is Presenting for Evaluation of leg swelling, which does require a range of treatment options, and is a complaint that involves a moderate risk of morbidity and mortality.  The Differential Diagnoses include DVT, gout, septic joint, etc.  I decided to review pertinent External Data, and in summary unable to see fluid results from [facility].  Radiologic Tests Ordered, included DVT US. I independently interpreted the images and agree with radiology interpretation.    Medical Decision Making: Summary:  Patient presents to the emergency department for evaluation of left leg swelling.  Patient has a moderate knee effusion on my exam but my overall suspicion for septic joint is very low.  He is not having fever.  It is minimally erythematous. Normal ROM. Plan for DVT US.   Reevaluation with update and discussion with patient. DVU [place] pending. Care transferred to Dr. Rodena Medin pending DVT US.   Patient's presentation is most consistent with acute presentation with potential threat to life or bodily function.   Disposition: pending   ____________________________________________  FINAL CLINICAL IMPRESSION(S) / ED DIAGNOSES  Final diagnoses:  Acute pain of left knee    Note:  This document was prepared using Dragon voice recognition software and may include unintentional dictation errors.  Alona Bene, MD, [facility] Emergency Medicine    Eshani Maestre, Arlyss Repress, MD 07/05/23 [phone]

## 2023-07-04 NOTE — ED Provider Notes (Signed)
Patient seen after prior EDP.  [place] negative for DVT.   Patient comfortable. He understands need for close FU with [facility] tomorrow.   Wynetta Fines, MD 07/04/23 [phone]

## 2023-07-05 DIAGNOSIS — M25562 Pain in left knee: Secondary | ICD-10-CM | POA: Diagnosis not present

## 2023-07-15 DIAGNOSIS — E7800 Pure hypercholesterolemia, unspecified: Secondary | ICD-10-CM | POA: Diagnosis not present

## 2023-07-15 DIAGNOSIS — Z79899 Other long term (current) drug therapy: Secondary | ICD-10-CM | POA: Diagnosis not present

## 2023-07-15 DIAGNOSIS — Z0000 Encounter for general adult medical examination without abnormal findings: Secondary | ICD-10-CM | POA: Diagnosis not present

## 2023-07-15 DIAGNOSIS — K909 Intestinal malabsorption, unspecified: Secondary | ICD-10-CM | POA: Diagnosis not present

## 2023-07-15 DIAGNOSIS — Z125 Encounter for screening for malignant neoplasm of prostate: Secondary | ICD-10-CM | POA: Diagnosis not present

## 2023-07-15 DIAGNOSIS — R591 Generalized enlarged lymph nodes: Secondary | ICD-10-CM | POA: Diagnosis not present

## 2023-07-18 DIAGNOSIS — M25562 Pain in left knee: Secondary | ICD-10-CM | POA: Diagnosis not present

## 2023-07-26 DIAGNOSIS — M25562 Pain in left knee: Secondary | ICD-10-CM | POA: Diagnosis not present

## 2023-08-02 ENCOUNTER — Other Ambulatory Visit (HOSPITAL_COMMUNITY): Payer: Self-pay | Admitting: Internal Medicine

## 2023-08-02 DIAGNOSIS — E7800 Pure hypercholesterolemia, unspecified: Secondary | ICD-10-CM

## 2023-08-10 ENCOUNTER — Ambulatory Visit (HOSPITAL_COMMUNITY)
Admission: RE | Admit: 2023-08-10 | Discharge: 2023-08-10 | Disposition: A | Discharge status: Home / Self Care | Payer: BC Managed Care – PPO | Source: Ambulatory Visit | Attending: Internal Medicine | Admitting: Internal Medicine

## 2023-08-10 DIAGNOSIS — E7800 Pure hypercholesterolemia, unspecified: Secondary | ICD-10-CM | POA: Insufficient documentation

## 2023-08-20 ENCOUNTER — Telehealth: Payer: Self-pay | Admitting: Genetic Counselor

## 2023-08-20 NOTE — Telephone Encounter (Signed)
Per IB Message on 08/11/23 I called patient and left a voice mail with the details of his Genetic Counseling and lab appointments. I also mailed a reminder notice.

## 2023-09-06 ENCOUNTER — Other Ambulatory Visit: Payer: Self-pay | Admitting: Genetic Counselor

## 2023-09-06 DIAGNOSIS — Z800 Family history of malignant neoplasm of digestive organs: Secondary | ICD-10-CM

## 2023-09-08 ENCOUNTER — Inpatient Hospital Stay: Payer: BC Managed Care – PPO | Attending: Genetic Counselor | Admitting: Genetic Counselor

## 2023-09-08 ENCOUNTER — Inpatient Hospital Stay: Payer: BC Managed Care – PPO

## 2023-09-08 ENCOUNTER — Other Ambulatory Visit: Payer: Self-pay

## 2023-09-08 ENCOUNTER — Encounter: Payer: Self-pay | Admitting: Genetic Counselor

## 2023-09-08 DIAGNOSIS — Z800 Family history of malignant neoplasm of digestive organs: Secondary | ICD-10-CM | POA: Diagnosis not present

## 2023-09-08 LAB — GENETIC SCREENING ORDER

## 2023-09-08 NOTE — Progress Notes (Signed)
REFERRING PROVIDER: Emilio Aspen, MD [address] [place],  [place] [address]  PRIMARY PROVIDER:  Emilio Aspen, MD  PRIMARY REASON FOR VISIT:  1. Family history of pancreatic cancer      HISTORY OF PRESENT ILLNESS:   Donald Ramos, a 52 y.o. male, was seen for a [facility] cancer genetics consultation at the request of Dr. Orson Aloe due to a family history of pancreatic cancer.  Donald Ramos presents to clinic today to discuss the possibility of a hereditary predisposition to cancer, genetic testing, and to further clarify his future cancer risks, as well as potential cancer risks for family members.   Donald Ramos is a 52 y.o. male with no personal history of cancer.  He had a colonoscopy at age 23 that found a few colon polyps.  He was asked to return in 5 years.  CANCER HISTORY:  Oncology History   No history exists.     Past Medical History:  Diagnosis Date   Family history of pancreatic cancer     No past surgical history on file.  Social History   Socioeconomic History   Marital status: Married    Spouse name: Not on file   Number of children: Not on file   Years of education: Not on file   Highest education level: Not on file  Occupational History   Not on file  Tobacco Use   Smoking status: Not on file   Smokeless tobacco: Not on file  Substance and Sexual Activity   Alcohol use: Not on file   Drug use: Not on file   Sexual activity: Not on file  Other Topics Concern   Not on file  Social History Narrative   Not on file   Social Determinants of Health   Financial Resource Strain: Not on file  Food Insecurity: Not on file  Transportation Needs: Not on file  Physical Activity: Not on file  Stress: Not on file  Social Connections: Not on file     FAMILY HISTORY:  We obtained a detailed, 4-generation family history.  Significant diagnoses are listed below: Family History  Problem Relation Age of Onset   Kidney disease  Mother    Parkinson's disease Father    Pancreatic cancer Maternal Grandfather        d. in his 16s   Parkinson's disease Paternal Grandfather    Pancreatic cancer Other        MGF's brother   Pancreatic cancer Other        MGF's brother   Hemangiomas Daughter        born with one on her liver.   The patient has one daughter who is cancer free.  She was born with a liver hemangioma that was resected soon after birth.  He had one brother who is cancer free and reportedly had tested negative for pancreatic cancer (unknown if this was genetic testing or tumor marker screening).  His mother is deceased and his father is living.  The patient's mother died at 69 due to kidney disease.  She had a brother and two sisters, none who had cancer.  The maternal grandparents are deceased.  The grandfather died of pancreatic cancer and had two brothers who also had pancreatic cancer.  The patient's father is 90, has a history of colon polyps and has a diagnosis of Parkinson's disease.  He had one brother who is deceased.  His mother died of heart disease and his father also had  Parkinson's disease.  Donald Ramos is unaware of previous family history of genetic testing for hereditary cancer risks. There is no reported Ashkenazi Jewish ancestry. There is no known consanguinity.  GENETIC COUNSELING ASSESSMENT: Donald Ramos is a 52 y.o. male with a family history of pancreatic cancer which is somewhat suggestive of a hereditary cancer syndrome and predisposition to cancer given the number of pancreatic cancer cases in the family. We, therefore, discussed and recommended the following at today's visit.   DISCUSSION: We discussed that, in general, most cancer is not inherited in families, but instead is sporadic or familial. Sporadic cancers occur by chance and typically happen at older ages (>50 years) as this type of cancer is caused by genetic changes acquired during an individual's lifetime. Some families have more  cancers than would be expected by chance; however, the ages or types of cancer are not consistent with a known genetic mutation or known genetic mutations have been ruled out. This type of familial cancer is thought to be due to a combination of multiple genetic, environmental, hormonal, and lifestyle factors. While this combination of factors likely increases the risk of cancer, the exact source of this risk is not currently identifiable or testable.  We discussed that 5 - 10% of cancer is hereditary, with most cases of pancreatic cancer associated with BRCA2, CDKN2A, ATM or Lynch syndrome mutations.  There are other genes that can be associated with hereditary pancreatic cancer syndromes.  These include PALB2, VHL and a few others.  We discussed that testing is beneficial for several reasons including knowing how to follow individuals after completing their treatment, identifying whether potential treatment options such as PARP inhibitors would be beneficial, and understand if other family members could be at risk for cancer and allow them to undergo genetic testing.   We reviewed the characteristics, features and inheritance patterns of hereditary cancer syndromes. We also discussed genetic testing, including the appropriate family members to test, the process of testing, insurance coverage and turn-around-time for results. We discussed the implications of a negative, positive, carrier and/or variant of uncertain significant result. Donald Ramos  was offered an expanded pan-cancer panel (70+ genes). Donald Ramos was informed of the benefits and limitations of each panel, including that expanded pan-cancer panels contain genes that do not have clear management guidelines at this point in time.   Donald Ramos decided to pursue genetic testing for the CancerNext-Expanded+RNAinsight gene panel.   The CancerNext-Expanded gene panel offered by [facility] and includes sequencing and rearrangement analysis for the  following 77 genes: AIP, ALK, APC*, ATM*, AXIN2, BAP1, BARD1, BMPR1A, BRCA1*, BRCA2*, BRIP1*, CDC73, CDH1*, CDK4, CDKN1B, CDKN2A, CHEK2*, CTNNA1, DICER1, FH, FLCN, KIF1B, LZTR1, MAX, MEN1, MET, MLH1*, MSH2*, MSH3, MSH6*, MUTYH*, NF1*, NF2, NTHL1, PALB2*, PHOX2B, PMS2*, POT1, PRKAR1A, PTCH1, PTEN*, RAD51C*, RAD51D*, RB1, RET, SDHA, SDHAF2, SDHB, SDHC, SDHD, SMAD4, SMARCA4, SMARCB1, SMARCE1, STK11, SUFU, TMEM127, TP53*, TSC1, TSC2, and VHL (sequencing and deletion/duplication); EGFR, EGLN1, HOXB13, KIT, MITF, PDGFRA, POLD1, and POLE (sequencing only); EPCAM and GREM1 (deletion/duplication only). DNA and RNA analyses performed for * genes.  Based on Donald Ramos's family history of cancer, he meets NCCN v.1.2025 Hereditary breast, ovarian, prostate and pancreatic cancer medical criteria for genetic testing. Despite that he meets criteria, he may still have an out of pocket cost. We discussed that if his out of pocket cost for testing is over $100, the laboratory will call and confirm whether he wants to proceed with testing.  If the out of pocket cost  of testing is less than $100 he will be billed by the genetic testing laboratory.   We discussed that some people do not want to undergo genetic testing due to fear of genetic discrimination.  The Genetic Information Nondiscrimination Act (GINA) was signed into federal law in 2008. GINA prohibits health insurers and most employers from discriminating against individuals based on genetic information (including the results of genetic tests and family history information). According to GINA, health insurance companies cannot consider genetic information to be a preexisting condition, nor can they use it to make decisions regarding coverage or rates. GINA also makes it illegal for most employers to use genetic information in making decisions about hiring, firing, promotion, or terms of employment. It is important to note that GINA does not offer protections for life  insurance, disability insurance, or long-term care insurance. GINA does not apply to those in the [employer], those who work for companies with less than 15 employees, and new life insurance or long-term disability insurance policies.  Health status due to a cancer diagnosis is not protected under GINA. More information about GINA can be found by visiting [email or web address].   PLAN: After considering the risks, benefits, and limitations, Donald Ramos provided informed consent to pursue genetic testing and the blood sample was sent to [facility] for analysis of the CancerNext-Expanded+RNAinsight. Results should be available within approximately 2-3 weeks' time, at which point they will be disclosed by telephone to Donald Ramos, as will any additional recommendations warranted by these results. Donald Ramos will receive a summary of his genetic counseling visit and a copy of his results once available. This information will also be available in Epic.   Lastly, we encouraged Donald Ramos to remain in contact with cancer genetics annually so that we can continuously update the family history and inform him of any changes in cancer genetics and testing that may be of benefit for this family.   Donald Ramos's questions were answered to his satisfaction today. Our contact information was provided should additional questions or concerns arise. Thank you for the referral and allowing [place] to share in the care of your patient.   Donald Ramos P. Lowell Guitar, MS, [facility] Licensed, Patent attorney [email or web address] phone: [phone]  The patient was seen for a total of 40 minutes in face-to-face genetic counseling.  The patient was seen alone.  Drs. Meliton Rattan, and/or [place] were available for questions, if needed..    _______________________________________________________________________ For Office Staff:  Number of people involved in session: 1 Was an Intern/ student involved with case: yes

## 2023-09-21 ENCOUNTER — Ambulatory Visit: Payer: BC Managed Care – PPO | Attending: Cardiology | Admitting: Cardiology

## 2023-09-21 ENCOUNTER — Encounter: Payer: Self-pay | Admitting: Cardiology

## 2023-09-21 VITALS — BP 102/72 | HR 69 | Resp 16 | Ht 70.0 in | Wt 160.8 lb

## 2023-09-21 DIAGNOSIS — E782 Mixed hyperlipidemia: Secondary | ICD-10-CM | POA: Diagnosis not present

## 2023-09-21 DIAGNOSIS — R931 Abnormal findings on diagnostic imaging of heart and coronary circulation: Secondary | ICD-10-CM

## 2023-09-21 NOTE — Patient Instructions (Addendum)
Medication Instructions:  Your physician recommends that you continue on your current medications as directed. Please refer to the Current Medication list given to you today.  *If you need a refill on your cardiac medications before your next appointment, please call your pharmacy*  Lab Work: None ordered today. If you have labs (blood work) drawn today and your tests are completely normal, you will receive your results only by: MyChart Message (if you have MyChart) OR A paper copy in the mail If you have any lab test that is abnormal or we need to change your treatment, we will call you to review the results.  Testing/Procedures: Your physician has requested that you have an exercise tolerance test. For further information please visit [email or web address] Please also follow instruction sheet, as given.  Your physician has requested that you have an echocardiogram. Echocardiography is a painless test that uses sound waves to create images of your heart. It provides your doctor with information about the size and shape of your heart and how well your heart's chambers and valves are working. This procedure takes approximately one hour. There are no restrictions for this procedure. Please do NOT wear cologne, perfume, aftershave, or lotions (deodorant is allowed). Please arrive 15 minutes prior to your appointment time.  Please note: We ask at that you not bring children with you during ultrasound (echo/ vascular) testing. Due to room size and safety concerns, children are not allowed in the ultrasound rooms during exams. Our front office staff cannot provide observation of children in our lobby area while testing is being conducted. An adult accompanying a patient to their appointment will only be allowed in the ultrasound room at the discretion of the ultrasound technician under special circumstances. We apologize for any inconvenience.   Follow-Up: At [facility], you and your health needs  are our priority.  As part of our continuing mission to provide you with exceptional heart care, we have created designated Provider Care Teams.  These Care Teams include your primary Cardiologist (physician) and Advanced Practice Providers (APPs -  Physician Assistants and Nurse Practitioners) who all work together to provide you with the care you need, when you need it.  Your next appointment:   1 year(s)  The format for your next appointment:   In Person  Provider:   Tessa Lerner, DO {  Other Instructions Exercise Stress Test   Please arrive 15 minutes prior to your appointment time to allow for registration and insurance purposes.  The test will take approximately 45 minutes to complete.  How to prepare for your Exercise Stress Test: - Do bring a list of your current medications with you. If you do not take any of the medications listed below, you may take your medications as normal the day of the test. - DO wear comfortable clothes (no dresses or overalls) and walking shoes, tennis shoes preferred (no heels or open toed shoes allowed).  If you can not keep you appointment, please provide 24 hours notification to the Stress Lab to avoid a possible $50 charge to your patient account.

## 2023-09-21 NOTE — Progress Notes (Signed)
Cardiology Office Note:    Date:  09/21/2023  NAME:  Donald Ramos    MRN: [id number] DOB:  Aug 19, 1971   PCP:  Emilio Aspen, MD  Former Cardiology Providers:  Primary Cardiologist:  Tessa Lerner, DO, [facility] (established care 09/21/2023) Electrophysiologist:  None   Referring MD: Emilio Aspen, *  Reason of Consult: Coronary artery calcification  Chief Complaint  Patient presents with   Elevated coronary artery calcium score   New Patient (Initial Visit)    History of Present Illness:    Donald Ramos is a 52 y.o.  male whose past medical history and cardiovascular risk factors includes: Coronary artery calcification, hyperlipidemia GERD, history of COVID-19 infection. He is being seen today for the evaluation of coronary artery calcification at the request of Emilio Aspen, *.  As part of his yearly well visit he had labs which noted hyperlipidemia.  He was advised to undergo coronary artery calcium score for further risk stratification.  He was noted to have moderate CAC with a total score of 317 placing him at the 96 percentile.  He was started on aspirin 81 mg p.o. daily as well as statin medication with PCP and now referred to cardiology for further evaluation and management.  Clinically denies anginal chest pain or heart failure symptoms.  Patient states that he consumes a clean diet and exercises regularly.  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  No family history of premature coronary artery disease in first-degree relatives.  But one of the uncles in his 25s had a myocardial infarction.  Current Medications: Current Meds  Medication Sig   ASPIRIN LOW DOSE 81 MG tablet Take 81 mg by mouth daily.   Lansoprazole (PREVACID PO) Take 1 tablet by mouth as needed (reflux).   loratadine (CLARITIN) 10 MG tablet Take 10 mg by mouth daily.   rosuvastatin (CRESTOR) 20 MG tablet Take 20 mg by mouth daily.      Allergies:    Patient has no known allergies.   Past Medical History: Past Medical History:  Diagnosis Date   Anxiety    Calcification of native coronary artery    Diverticulosis large intestine w/o perforation or abscess w/o bleeding    ED (erectile dysfunction)    Family history of colonic polyps    Family history of pancreatic cancer    Gastroesophageal reflux disease    Hx of adenomatous colonic polyps    Paresthesia of left arm     Past Surgical History: History reviewed. No pertinent surgical history.  Social History: Social History   Tobacco Use   Smoking status: Never   Smokeless tobacco: Never  Substance Use Topics   Alcohol use: Not Currently   Drug use: Never    Family History: Family History  Problem Relation Age of Onset   Kidney disease Mother    Parkinson's disease Father    Pancreatic cancer Maternal Grandfather        d. in his 19s   Parkinson's disease Paternal Grandfather    Pancreatic cancer Other        MGF's brother   Pancreatic cancer Other        MGF's brother   Hemangiomas Daughter        born with one on her liver.    ROS:   Review of Systems  Cardiovascular:  Negative for chest pain, claudication, irregular heartbeat, leg swelling, near-syncope, orthopnea, palpitations, paroxysmal nocturnal  dyspnea and syncope.  Respiratory:  Negative for shortness of breath.   Hematologic/Lymphatic: Negative for bleeding problem.    EKGs/Labs/Other Studies Reviewed:   EKG Interpretation Date/Time:  Tuesday September 21 2023 11:08:26 EST Ventricular Rate:  69 PR Interval:  136 QRS Duration:  98 QT Interval:  398 QTC Calculation: 426 R Axis:   74  Text Interpretation: Normal sinus rhythm Normal ECG No previous ECGs available Confirmed by Tessa Lerner [id number]) on 09/21/2023 11:34:53 AM    Coronary Calcium Score 07/2023: Left main: 0 Left anterior descending artery: 139 Left circumflex artery: 15.7 Right coronary artery: 162 Total:  317 Percentile: 96th  Labs: External Labs: Collected: August 2024 provided by the patient. Total cholesterol 200, triglycerides 65, HDL 46, calculated LDL 143, HDL 158. BUN 20, creatinine 1.05. Sodium 140, potassium 4.8, chloride 104, bicarb 29. AST, ALT, alkaline phosphatase within normal limits.   Physical Exam:    Today's Vitals   09/21/23 1104  BP: 102/72  Pulse: 69  Resp: 16  SpO2: 95%  Weight: 160 lb 12.8 oz (72.9 kg)  Height: 5\' 10"  (1.778 m)   Body mass index is 23.07 kg/m. Wt Readings from Last 3 Encounters:  09/21/23 160 lb 12.8 oz (72.9 kg)  07/04/23 160 lb (72.6 kg)    Physical Exam  Constitutional: No distress.  hemodynamically stable  Neck: No JVD present.  Cardiovascular: Normal rate, regular rhythm, S1 normal and S2 normal. Exam reveals no gallop, no S3 and no S4.  No murmur heard. Pulmonary/Chest: Effort normal and breath sounds normal. No stridor. He has no wheezes. He has no rales.  Abdominal: Soft. Bowel sounds are normal. He exhibits no distension. There is no abdominal tenderness.  Musculoskeletal:        General: No edema.     Cervical back: Neck supple.  Neurological: He is alert and oriented to person, place, and time. He has intact cranial nerves (2-12).  Skin: Skin is warm.    Impression & Recommendation(s):  Impression:   ICD-10-CM   1. Elevated coronary artery calcium score  R93.1 EKG 12-Lead    ECHOCARDIOGRAM COMPLETE    EXERCISE TOLERANCE TEST (ETT)    2. Mixed hyperlipidemia  E78.2        Recommendation(s):  Elevated coronary artery calcium score Notes to have moderate CAC-total score 317, placing him at the 96 percentile. Denies anginal chest pain or heart failure symptoms. Overall functional capacity is excellent for age as noted above. I agree with starting aspirin 81 mg p.o. daily and statin therapy-initiated by PCP. Recommend checking a fasting lipid profile in 6 weeks after being on rosuvastatin 20 mg p.o.  nightly. Echo will be ordered to evaluate for structural heart disease and left ventricular systolic function. Exercise treadmill stress test to evaluate for exercise-induced ischemia. Reemphasized the importance of secondary prevention with focus on improving her modifiable cardiovascular risk factors such as glycemic control, lipid management, blood pressure control if needed.  Mixed hyperlipidemia Indexed LDL 143 mg/dL. Total CAC 317, placing him at the 96 percentile. Started on rosuvastatin 20 mg p.o. nightly by PCP. Tolerating the medication well. Recommend checking a fasting lipid profile in 6 weeks to reevaluate therapy-likely with PCP.  Orders Placed:  Orders Placed This Encounter  Procedures   EXERCISE TOLERANCE TEST (ETT)    Standing Status:   Future    Standing Expiration Date:   09/20/2024    Order Specific Question:   Where should this test be performed    Answer:  [facility])    Order Specific Question:   Stress with pharmacologic or treadmill ?    Answer:   Treadmill w/ exercise    Order Specific Question:   Is patient able to ambulate on a treadmill?    Answer:   Yes   EKG 12-Lead   ECHOCARDIOGRAM COMPLETE    Standing Status:   Future    Standing Expiration Date:   09/20/2024    Order Specific Question:   Where should this test be performed    Answer:   [facility])    Order Specific Question:   Does the patient weigh less than or greater than 250 lbs?    Answer:   Patient weighs less than 250 lbs    Order Specific Question:   Perflutren DEFINITY (image enhancing agent) should be administered unless hypersensitivity or allergy exist    Answer:   Administer Perflutren    Order Specific Question:   Reason for exam-Echo    Answer:   Other-Full Diagnosis List    Order Specific Question:   Full ICD-10/Reason for Exam    Answer:   Elevated coronary artery calcium score [6433295]    As part of medical decision making  results of the office notes provided by referring physician, external labs from August 2024, coronary calcium score, EKG were reviewed independently at today's visit.   Final Medication List:   No orders of the defined types were placed in this encounter.   There are no discontinued medications.   Current Outpatient Medications:    ASPIRIN LOW DOSE 81 MG tablet, Take 81 mg by mouth daily., Disp: , Rfl:    Lansoprazole (PREVACID PO), Take 1 tablet by mouth as needed (reflux)., Disp: , Rfl:    loratadine (CLARITIN) 10 MG tablet, Take 10 mg by mouth daily., Disp: , Rfl:    rosuvastatin (CRESTOR) 20 MG tablet, Take 20 mg by mouth daily., Disp: , Rfl:   Consent:   Informed Consent   Shared Decision Making/Informed Consent The risks [chest pain, shortness of breath, cardiac arrhythmias, dizziness, blood pressure fluctuations, myocardial infarction, stroke/transient ischemic attack, and life-threatening complications (estimated to be 1 in 10,000)], benefits (risk stratification, diagnosing coronary artery disease, treatment guidance) and alternatives of an exercise tolerance test were discussed in detail with Mr. Esterline and he agrees to proceed.     Disposition:   1 year follow-up sooner if needed Patient may be asked to follow-up sooner based on the results of the above-mentioned testing.  His questions and concerns were addressed to his satisfaction. He voices understanding of the recommendations provided during this encounter.    Signed, Tessa Lerner, DO, [facility]  [facility]  [address] [place], [place] [address] 09/21/2023 12:50 PM

## 2023-09-29 ENCOUNTER — Encounter: Payer: Self-pay | Admitting: Genetic Counselor

## 2023-09-29 ENCOUNTER — Ambulatory Visit: Payer: Self-pay | Admitting: Genetic Counselor

## 2023-09-29 ENCOUNTER — Telehealth: Payer: Self-pay | Admitting: Genetic Counselor

## 2023-09-29 DIAGNOSIS — Z1379 Encounter for other screening for genetic and chromosomal anomalies: Secondary | ICD-10-CM | POA: Insufficient documentation

## 2023-09-29 NOTE — Progress Notes (Signed)
HPI:  Donald Ramos was previously seen in the [facility] due to a family history of pancreatic cancer and concerns regarding a hereditary predisposition to cancer. Please refer to our prior cancer genetics clinic note for more information regarding our discussion, assessment and recommendations, at the time. Donald Ramos's recent genetic test results were disclosed to him, as were recommendations warranted by these results. These results and recommendations are discussed in more detail below.  CANCER HISTORY:  Oncology History   No history exists.    FAMILY HISTORY:  We obtained a detailed, 4-generation family history.  Significant diagnoses are listed below: Family History  Problem Relation Age of Onset   Kidney disease Mother    Parkinson's disease Father    Pancreatic cancer Maternal Grandfather        d. in his 64s   Parkinson's disease Paternal Grandfather    Pancreatic cancer Other        MGF's brother   Pancreatic cancer Other        MGF's brother   Hemangiomas Daughter        born with one on her liver.     The patient has one daughter who is cancer free.  She was born with a liver hemangioma that was resected soon after birth.  He had one brother who is cancer free and reportedly had tested negative for pancreatic cancer (unknown if this was genetic testing or tumor marker screening).  His mother is deceased and his father is living.   The patient's mother died at 63 due to kidney disease.  She had a brother and two sisters, none who had cancer.  The maternal grandparents are deceased.  The grandfather died of pancreatic cancer and had two brothers who also had pancreatic cancer.   The patient's father is 13, has a history of colon polyps and has a diagnosis of Parkinson's disease.  He had one brother who is deceased.  His mother died of heart disease and his father also had Parkinson's disease.   Donald Ramos is unaware of previous family history of genetic  testing for hereditary cancer risks. There is no reported Ashkenazi Jewish ancestry. There is no known consanguinity.     GENETIC TEST RESULTS: Genetic testing reported out on September 28, 2023 through the CancerNext-Expanded+RNAinsight cancer panel found no pathogenic mutations. The CancerNext-Expanded gene panel offered by [employer] and includes sequencing and rearrangement analysis for the following 71 genes: AIP, ALK, APC, ATM, BAP1, BARD1, BMPR1A, BRCA1, BRCA2, BRIP1, CDC73, CDH1, CDK4, CDKN1B, CDKN2A, CHEK2, DICER1, FH, FLCN, KIF1B, LZTR1, MAX, MEN1, MET, MLH1, MSH2, MSH6, MUTYH, NF1, NF2, NTHL1, PALB2, PHOX2B, PMS2, POT1, PRKAR1A, PTCH1, PTEN, RAD51C, RAD51D, RB1, RET, SDHA, SDHAF2, SDHB, SDHC, SDHD, SMAD4, SMARCA4, SMARCB1, SMARCE1, STK11, SUFU, TMEM127, TP53, TSC1, TSC2 and VHL (sequencing and deletion/duplication); AXIN2, CTNNA1, EGFR, EGLN1, HOXB13, KIT, MITF, MSH3, PDGFRA, POLD1 and POLE (sequencing only); EPCAM and GREM1 (deletion/duplication only). RNA data is routinely analyzed for use in variant interpretation for all genes. The test report has been scanned into EPIC and is located under the Molecular Pathology section of the Results Review tab.  A portion of the result report is included below for reference.     We discussed with Donald Ramos that because current genetic testing is not perfect, it is possible there may be a gene mutation in one of these genes that current testing cannot detect, but that chance is small.  We also discussed, that there could be another gene  that has not yet been discovered, or that we have not yet tested, that is responsible for the cancer diagnoses in the family. It is also possible there is a hereditary cause for the cancer in the family that Donald Ramos did not inherit and therefore was not identified in his testing.  Therefore, it is important to remain in touch with cancer genetics in the future so that we can continue to offer Donald Ramos the most up to  date genetic testing.   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  At this time, it is unknown if this variant is associated with increased cancer risk or if this is a normal finding, but most variants such as this get reclassified to being inconsequential. It should not be used to make medical management decisions. With time, we suspect the lab will determine the significance of this variant, if any. If we do learn more about it, we will try to contact Donald Ramos to discuss it further. However, it is important to stay in touch with [place] periodically and keep the address and phone number up to date.  ADDITIONAL GENETIC TESTING: We discussed with Mr. Donald Ramos that his genetic testing was fairly extensive.  If there are genes identified to increase cancer risk that can be analyzed in the future, we would be happy to discuss and coordinate this testing at that time.    CANCER SCREENING RECOMMENDATIONS: Mr. Donald Ramos test result is considered negative (normal).  This means that we have not identified a hereditary cause for his family history of pancreatic cancer at this time. Most cancers happen by chance and this negative test suggests that his family history of cancer may fall into this category.    Possible reasons for Mr. Donald Ramos negative genetic test include:  1. There may be a gene mutation in one of these genes that current testing methods cannot detect but that chance is small.  2. There could be another gene that has not yet been discovered, or that we have not yet tested, that is responsible for the cancer diagnoses in the family.  3.  There may be no hereditary risk for cancer in the family. The cancers in Mr. Donald Ramos and/or his family may be sporadic/familial or due to other genetic and environmental factors. 4. It is also possible there is a hereditary cause for the cancer in the family that Mr. Donald Ramos did not  inherit.  Therefore, it is recommended he continue to follow the cancer management and screening guidelines provided by his oncology and primary healthcare provider. An individual's cancer risk and medical management are not determined by genetic test results alone. Overall cancer risk assessment incorporates additional factors, including personal medical history, family history, and any available genetic information that may result in a personalized plan for cancer prevention and surveillance  RECOMMENDATIONS FOR FAMILY MEMBERS:  Individuals in this family might be at some increased risk of developing cancer, over the general population risk, simply due to the family history of cancer.  We recommended women in this family have a yearly mammogram beginning at age 69, or 57 years younger than the earliest onset of cancer, an annual clinical breast exam, and perform monthly breast self-exams. Women in this family should also have a gynecological exam as recommended by their primary provider. All family members should be referred for colonoscopy starting at age 28.  FOLLOW-UP: Lastly, we discussed with Mr. Donald Ramos that cancer genetics  is a rapidly advancing field and it is possible that new genetic tests will be appropriate for him and/or his family members in the future. We encouraged him to remain in contact with cancer genetics on an annual basis so we can update his personal and family histories and let him know of advances in cancer genetics that may benefit this family.   Our contact number was provided. Mr. Donald Ramos's questions were answered to his satisfaction, and he knows he is welcome to call us at anytime with additional questions or concerns.   Maylon Cos, MS, [facility] Licensed, Certified Genetic Counselor [email or web address]

## 2023-09-29 NOTE — Telephone Encounter (Signed)
Revealed negative genetic testing.  Discussed that we do not know why there is cancer in the family. It could be due to a different gene that we are not testing, or maybe our current technology may not be able to pick something up.  It will be important for him to keep in contact with genetics to keep up with whether additional testing may be needed.   KIF1B VUS identified.  This will not change medical management.

## 2023-10-05 DIAGNOSIS — Z79899 Other long term (current) drug therapy: Secondary | ICD-10-CM | POA: Diagnosis not present

## 2023-11-02 ENCOUNTER — Ambulatory Visit (INDEPENDENT_AMBULATORY_CARE_PROVIDER_SITE_OTHER): Payer: BC Managed Care – PPO

## 2023-11-02 ENCOUNTER — Ambulatory Visit: Payer: BC Managed Care – PPO | Attending: Cardiology

## 2023-11-02 DIAGNOSIS — R931 Abnormal findings on diagnostic imaging of heart and coronary circulation: Secondary | ICD-10-CM

## 2023-11-02 LAB — ECHOCARDIOGRAM COMPLETE
Area-P 1/2: 4.73 cm2
S' Lateral: 3.5 cm

## 2023-11-02 LAB — EXERCISE TOLERANCE TEST
Angina Index: 0
Duke Treadmill Score: 12
Estimated workload: 13.4
Exercise duration (min): 12 min
Exercise duration (sec): 0 s
MPHR: 169 {beats}/min
Peak HR: 155 {beats}/min
Percent HR: 91 %
RPE: 16
Rest HR: 66 {beats}/min
ST Depression (mm): 0 mm

## 2024-04-14 DIAGNOSIS — K4090 Unilateral inguinal hernia, without obstruction or gangrene, not specified as recurrent: Secondary | ICD-10-CM | POA: Diagnosis not present

## 2024-05-05 DIAGNOSIS — W57XXXA Bitten or stung by nonvenomous insect and other nonvenomous arthropods, initial encounter: Secondary | ICD-10-CM | POA: Diagnosis not present

## 2024-05-05 DIAGNOSIS — L0390 Cellulitis, unspecified: Secondary | ICD-10-CM | POA: Diagnosis not present

## 2024-05-08 DIAGNOSIS — L03317 Cellulitis of buttock: Secondary | ICD-10-CM | POA: Diagnosis not present

## 2024-05-08 DIAGNOSIS — L0231 Cutaneous abscess of buttock: Secondary | ICD-10-CM | POA: Diagnosis not present

## 2024-05-10 DIAGNOSIS — L0231 Cutaneous abscess of buttock: Secondary | ICD-10-CM | POA: Diagnosis not present

## 2024-07-19 DIAGNOSIS — Z79899 Other long term (current) drug therapy: Secondary | ICD-10-CM | POA: Diagnosis not present

## 2024-07-19 DIAGNOSIS — Z125 Encounter for screening for malignant neoplasm of prostate: Secondary | ICD-10-CM | POA: Diagnosis not present

## 2024-07-19 DIAGNOSIS — K909 Intestinal malabsorption, unspecified: Secondary | ICD-10-CM | POA: Diagnosis not present

## 2024-07-19 DIAGNOSIS — E7800 Pure hypercholesterolemia, unspecified: Secondary | ICD-10-CM | POA: Diagnosis not present

## 2024-07-19 DIAGNOSIS — E559 Vitamin D deficiency, unspecified: Secondary | ICD-10-CM | POA: Diagnosis not present

## 2024-08-22 DIAGNOSIS — K4090 Unilateral inguinal hernia, without obstruction or gangrene, not specified as recurrent: Secondary | ICD-10-CM | POA: Diagnosis not present

## 2024-09-22 ENCOUNTER — Ambulatory Visit: Payer: Self-pay | Admitting: General Surgery

## 2024-09-22 NOTE — Pre-Procedure Instructions (Signed)
 Surgical Instructions   Your procedure is scheduled on Thursday, November 13th. Report to [facility] Main Entrance A at 10:15 A.M., then check in with the Admitting office. Any questions or running late day of surgery: call [phone]  Questions prior to your surgery date: call [phone], Monday-Friday, 8am-4pm. If you experience any cold or flu symptoms such as cough, fever, chills, shortness of breath, etc. between now and your scheduled surgery, please notify us  at the above number.     Remember:  Do not eat after midnight the night before your surgery   You may drink clear liquids until 09:15 AM the morning of your surgery.   Clear liquids allowed are: Water, Non-Citrus Juices (without pulp), Carbonated Beverages, Clear Tea (no milk, honey, etc.), Black Coffee Only (NO MILK, CREAM OR POWDERED CREAMER of any kind), and Gatorade.    Take these medicines the morning of surgery with A SIP OF WATER  loratadine (CLARITIN)  rosuvastatin (CRESTOR)   May take these medicines IF NEEDED: Lansoprazole (PREVACID PO)    Follow your surgeon's instructions on when to stop Aspirin.  If no instructions were given by your surgeon then you will need to call the office to get those instructions.    One week prior to surgery, STOP taking any Aleve, Naproxen, Ibuprofen, Motrin, Advil, Goody's, BC's, all herbal medications, fish oil, and non-prescription vitamins.                     Do NOT Smoke (Tobacco/Vaping) for 24 hours prior to your procedure.  If you use a CPAP at night, you may bring your mask/headgear for your overnight stay.   You will be asked to remove any contacts, glasses, piercing's, hearing aid's, dentures/partials prior to surgery. Please bring cases for these items if needed.    Patients discharged the day of surgery will not be allowed to drive home, and someone needs to stay with them for 24 hours.  SURGICAL WAITING ROOM VISITATION Patients may have no more than 2  support people in the waiting area - these visitors may rotate.   Pre-op nurse will coordinate an appropriate time for 1 ADULT support person, who may not rotate, to accompany patient in pre-op.  Children under the age of 70 must have an adult with them who is not the patient and must remain in the main waiting area with an adult.  If the patient needs to stay at the hospital during part of their recovery, the visitor guidelines for inpatient rooms apply.  Please refer to the [facility] website for the visitor guidelines for any additional information.   If you received a COVID test during your pre-op visit  it is requested that you wear a mask when out in public, stay away from anyone that may not be feeling well and notify your surgeon if you develop symptoms. If you have been in contact with anyone that has tested positive in the last 10 days please notify you surgeon.      Pre-operative CHG Bathing Instructions   You can play a key role in reducing the risk of infection after surgery. Your skin needs to be as free of germs as possible. You can reduce the number of germs on your skin by washing with CHG (chlorhexidine gluconate) soap before surgery. CHG is an antiseptic soap that kills germs and continues to kill germs even after washing.   DO NOT use if you have an allergy to chlorhexidine/CHG or antibacterial soaps. If  your skin becomes reddened or irritated, stop using the CHG and notify one of our RNs at [phone].              TAKE A SHOWER THE NIGHT BEFORE SURGERY   Please keep in mind the following:  DO NOT shave, including legs and underarms, 48 hours prior to surgery.   You may shave your face before/day of surgery.  Place clean sheets on your bed the night before surgery Use a clean washcloth (not used since being washed) for shower. DO NOT sleep with pet's night before surgery.  CHG Shower Instructions:  Wash your face and private area with normal soap. If you choose  to wash your hair, wash first with your normal shampoo.  After you use shampoo/soap, rinse your hair and body thoroughly to remove shampoo/soap residue.  Turn the water OFF and apply half the bottle of CHG soap to a CLEAN washcloth.  Apply CHG soap ONLY FROM YOUR NECK DOWN TO YOUR TOES (washing for 3-5 minutes)  DO NOT use CHG soap on face, private areas, open wounds, or sores.  Pay special attention to the area where your surgery is being performed.  If you are having back surgery, having someone wash your back for you may be helpful. Wait 2 minutes after CHG soap is applied, then you may rinse off the CHG soap.  Pat dry with a clean towel  Put on clean pajamas    Additional instructions for the day of surgery: If you choose, you may shower the morning of surgery with an antibacterial soap.  DO NOT APPLY any lotions, deodorants, cologne, or perfumes.   Do not wear jewelry or makeup Do not wear nail polish, gel polish, artificial nails, or any other type of covering on natural nails (fingers and toes) Do not bring valuables to the hospital. [facility] is not responsible for valuables/personal belongings. Put on clean/comfortable clothes.  Please brush your teeth.  Ask your nurse before applying any prescription medications to the skin.

## 2024-09-25 ENCOUNTER — Other Ambulatory Visit: Payer: Self-pay

## 2024-09-25 ENCOUNTER — Encounter (HOSPITAL_COMMUNITY)
Admission: RE | Admit: 2024-09-25 | Discharge: 2024-09-25 | Disposition: A | Discharge status: Home / Self Care | Payer: Self-pay | Source: Ambulatory Visit | Attending: General Surgery | Admitting: General Surgery

## 2024-09-25 ENCOUNTER — Encounter (HOSPITAL_COMMUNITY): Payer: Self-pay

## 2024-09-25 VITALS — BP 129/85 | HR 93 | Temp 98.3°F | Resp 18 | Ht 70.0 in | Wt 167.2 lb

## 2024-09-25 DIAGNOSIS — Z01818 Encounter for other preprocedural examination: Secondary | ICD-10-CM | POA: Insufficient documentation

## 2024-09-25 DIAGNOSIS — I2510 Atherosclerotic heart disease of native coronary artery without angina pectoris: Secondary | ICD-10-CM | POA: Diagnosis not present

## 2024-09-25 DIAGNOSIS — E785 Hyperlipidemia, unspecified: Secondary | ICD-10-CM | POA: Insufficient documentation

## 2024-09-25 DIAGNOSIS — K219 Gastro-esophageal reflux disease without esophagitis: Secondary | ICD-10-CM | POA: Insufficient documentation

## 2024-09-25 DIAGNOSIS — K4090 Unilateral inguinal hernia, without obstruction or gangrene, not specified as recurrent: Secondary | ICD-10-CM | POA: Diagnosis not present

## 2024-09-25 DIAGNOSIS — Z79899 Other long term (current) drug therapy: Secondary | ICD-10-CM | POA: Insufficient documentation

## 2024-09-25 DIAGNOSIS — Z87891 Personal history of nicotine dependence: Secondary | ICD-10-CM | POA: Diagnosis not present

## 2024-09-25 DIAGNOSIS — Z01810 Encounter for preprocedural cardiovascular examination: Secondary | ICD-10-CM

## 2024-09-25 HISTORY — DX: Family history of other specified conditions: Z84.89

## 2024-09-25 LAB — BASIC METABOLIC PANEL WITH GFR
Anion gap: 10 (ref 5–15)
BUN: 13 mg/dL (ref 6–20)
CO2: 26 mmol/L (ref 22–32)
Calcium: 9.6 mg/dL (ref 8.9–10.3)
Chloride: 105 mmol/L (ref 98–111)
Creatinine, Ser: 1.07 mg/dL (ref 0.61–1.24)
GFR, Estimated: >60 mL/min (ref >60)
Glucose, Bld: 92 mg/dL (ref 70–99)
Potassium: 4.6 mmol/L (ref 3.5–5.1)
Sodium: 141 mmol/L (ref 135–145)

## 2024-09-25 LAB — CBC
HCT: 42.6 % (ref 39.0–52.0)
Hemoglobin: 14.4 g/dL (ref 13.0–17.0)
MCH: 29.3 pg (ref 26.0–34.0)
MCHC: 33.8 g/dL (ref 30.0–36.0)
MCV: 86.8 fL (ref 80.0–100.0)
Platelets: 193 K/uL (ref 150–400)
RBC: 4.91 MIL/uL (ref 4.22–5.81)
RDW: 13.3 % (ref 11.5–15.5)
WBC: 5.3 K/uL (ref 4.0–10.5)
nRBC: 0 % (ref 0.0–0.2)

## 2024-09-25 NOTE — Progress Notes (Signed)
 Surgical Instructions     Your procedure is scheduled on Thursday, November 13th. Report to [facility] Main Entrance A at 10:15 A.M., then check in with the Admitting office. Any questions or running late day of surgery: call [phone]   Questions prior to your surgery date: call [phone], Monday-Friday, 8am-4pm. If you experience any cold or flu symptoms such as cough, fever, chills, shortness of breath, etc. between now and your scheduled surgery, please notify us  at the above number.            Remember:       Do not eat after midnight the night before your surgery   You may drink clear liquids until 09:15 AM the morning of your surgery.   Clear liquids allowed are: Water, Non-Citrus Juices (without pulp), Carbonated Beverages, Clear Tea (no milk, honey, etc.), Black Coffee Only (NO MILK, CREAM OR POWDERED CREAMER of any kind), and Gatorade.   Patient Instructions  The night before surgery:  No food after midnight. ONLY clear liquids after midnight  The day of surgery (if you do NOT have diabetes):  Drink ONE (1) Pre-Surgery Clear Ensure by 9:15 the morning of surgery. Drink in one sitting. Do not sip.  This drink was given to you during your hospital pre-op appointment visit.  Nothing else to drink after completing the Pre-Surgery Clear Ensure.         If you have questions, please contact your surgeon's office.        Take these medicines the morning of surgery with A SIP OF WATER  loratadine (CLARITIN)  rosuvastatin (CRESTOR)    May take these medicines IF NEEDED: Lansoprazole (PREVACID PO)      Follow your surgeon's instructions on when to stop Aspirin.  If no instructions were given by your surgeon then you will need to call the office to get those instructions.     One week prior to surgery, STOP taking any Aleve, Naproxen, Ibuprofen, Motrin, Advil, Goody's, BC's, all herbal medications, fish oil, and non-prescription vitamins.                     Do NOT  Smoke (Tobacco/Vaping) for 24 hours prior to your procedure.   If you use a CPAP at night, you may bring your mask/headgear for your overnight stay.   You will be asked to remove any contacts, glasses, piercing's, hearing aid's, dentures/partials prior to surgery. Please bring cases for these items if needed.    Patients discharged the day of surgery will not be allowed to drive home, and someone needs to stay with them for 24 hours.   SURGICAL WAITING ROOM VISITATION Patients may have no more than 2 support people in the waiting area - these visitors may rotate.   Pre-op nurse will coordinate an appropriate time for 1 ADULT support person, who may not rotate, to accompany patient in pre-op.  Children under the age of 45 must have an adult with them who is not the patient and must remain in the main waiting area with an adult.   If the patient needs to stay at the hospital during part of their recovery, the visitor guidelines for inpatient rooms apply.   Please refer to the [facility] website for the visitor guidelines for any additional information.     If you received a COVID test during your pre-op visit  it is requested that you wear a mask when out in public, stay away from anyone that may  not be feeling well and notify your surgeon if you develop symptoms. If you have been in contact with anyone that has tested positive in the last 10 days please notify you surgeon.         Pre-operative CHG Bathing Instructions    You can play a key role in reducing the risk of infection after surgery. Your skin needs to be as free of germs as possible. You can reduce the number of germs on your skin by washing with CHG (chlorhexidine gluconate) soap before surgery. CHG is an antiseptic soap that kills germs and continues to kill germs even after washing.    DO NOT use if you have an allergy to chlorhexidine/CHG or antibacterial soaps. If your skin becomes reddened or irritated, stop using the CHG  and notify one of our RNs at [phone].               TAKE A SHOWER THE NIGHT BEFORE SURGERY    Please keep in mind the following:  You may shave your face before/day of surgery.  Place clean sheets on your bed the night before surgery Use a clean washcloth (not used since being washed) for shower. DO NOT sleep with pet's night before surgery.   CHG Shower Instructions:  Wash your face and private area with normal soap. If you choose to wash your hair, wash first with your normal shampoo.  After you use shampoo/soap, rinse your hair and body thoroughly to remove shampoo/soap residue.  Turn the water OFF and apply half the bottle of CHG soap to a CLEAN washcloth.  Apply CHG soap ONLY FROM YOUR NECK DOWN TO YOUR TOES (washing for 3-5 minutes)  DO NOT use CHG soap on face, private areas, open wounds, or sores.  Pay special attention to the area where your surgery is being performed.  If you are having back surgery, having someone wash your back for you may be helpful. Wait 2 minutes after CHG soap is applied, then you may rinse off the CHG soap.  Pat dry with a clean towel  Put on clean pajamas     Additional instructions for the day of surgery: If you choose, you may shower the morning of surgery with an antibacterial soap.  DO NOT APPLY any lotions, deodorants, or cologne   Do not wear jewelry  Do not bring valuables to the hospital. [facility] is not responsible for valuables/personal belongings. Put on clean/comfortable clothes.  Please brush your teeth.  Ask your nurse before applying any prescription medications to the skin.

## 2024-09-25 NOTE — Progress Notes (Addendum)
 PCP - Dorn DELENA Sauers Cardiologist - Dr. Madonna Large - follow up as needed  PPM/ICD - denies Device Orders - n/a Rep Notified - n/a  Chest x-ray - n/a EKG - 09-25-24 Stress Test - 11-02-23 ECHO - 11/02/23 Cardiac Cath - denies  Sleep Study - denies CPAP - n/a  NON-diabetic  Last dose of GLP1 agonist-  Denies GLP1 instructions: n/a  Blood Thinner Instructions:Denies Aspirin Instructions: Per patient he is going to call his physician and when to stop  ERAS Protcol - Clears until 0915 PRE-SURGERY Ensure or G2- Ensure  COVID TEST- n/a   Anesthesia review: yes, CAD w/new EKG.  Daughter had cisatracurium anaphylaxis during a surgery at a young age. Daughter is has anaphylaxis to all non-depolarizing neuromuscular blockers.  Patient denies shortness of breath, fever, cough and chest pain at PAT appointment. Patient denies any respiratory issues at this time.    All instructions explained to the patient, with a verbal understanding of the material. Patient agrees to go over the instructions while at home for a better understanding. Patient also instructed to self quarantine after being tested for COVID-19. The opportunity to ask questions was provided.

## 2024-09-26 NOTE — Anesthesia Preprocedure Evaluation (Signed)
 Anesthesia Evaluation  Patient identified by MRN, date of birth, ID band Patient awake    Reviewed: Allergy & Precautions, NPO status , Patient's Chart, lab work & pertinent test results  History of Anesthesia Complications Negative for: history of anesthetic complications  Airway Mallampati: II  TM Distance: >3 FB Neck ROM: Full    Dental no notable dental hx. (+) Teeth Intact   Pulmonary neg pulmonary ROS, neg sleep apnea, neg COPD, Patient abstained from smoking.Not current smoker   Pulmonary exam normal breath sounds clear to auscultation       Cardiovascular Exercise Tolerance: Good METS: > 9 Mets (-) hypertension+ CAD  (-) Past MI (-) dysrhythmias  Rhythm:Regular Rate:Normal - Systolic murmurs    Neuro/Psych  PSYCHIATRIC DISORDERS Anxiety     negative neurological ROS     GI/Hepatic ,GERD  Controlled and Medicated,,(+)     (-) substance abuse    Endo/Other  neg diabetes    Renal/GU negative Renal ROS     Musculoskeletal   Abdominal   Peds  Hematology   Anesthesia Other Findings Patient was evaluated by cardiologist Dr. Michele 09/21/2023 at the request of PCP for elevated coronary artery calcium score of 317 placing him in the 96th percentile.  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  Echocardiogram and exercise treadmill stress test were ordered.  Echo 11/02/2023 showed LVEF 55 to 60%, normal RV, no significant valvular abnormalities.  Exercise tolerance test 11/02/2023 was negative for ischemia, excellent exercise capacity with 13.4 METS achieved.   At preadmission testing appointment patient reported that his daughter had an anaphylactic reaction to cisatracurium when she had surgery as an infant.  No other known family history of adverse reactions to anesthesia.  Patient has never had general anesthesia.  Patient did question whether or not he  would be expected to have any negative reaction to nondepolarizing neuromuscular blocking agents.  I reviewed this with anesthesiologist Dr. Keneth.  He felt the likelihood of having any adverse reaction to rocuronium would be low and he anticipated that it could be used safely.   Reproductive/Obstetrics                              Anesthesia Physical Anesthesia Plan  ASA: 2  Anesthesia Plan: General   Post-op Pain Management: Tylenol PO (pre-op)* and Toradol IV (intra-op)*   Induction: Intravenous  PONV Risk Score and Plan: 3 and Ondansetron, Dexamethasone and Midazolam  Airway Management Planned: Oral ETT  Additional Equipment: None  Intra-op Plan:   Post-operative Plan: Extubation in OR  Informed Consent: I have reviewed the patients History and Physical, chart, labs and discussed the procedure including the risks, benefits and alternatives for the proposed anesthesia with the patient or authorized representative who has indicated his/her understanding and acceptance.     Dental advisory given  Plan Discussed with: CRNA and Surgeon  Anesthesia Plan Comments: (PAT note by Lynwood Hope, PA-C: 53 year old male with pertinent history including GERD on PPI, HLD, elevated coronary calcium score.  Patient was evaluated by cardiologist Dr. Michele 09/21/2023 at the request of PCP for elevated coronary artery calcium score of 317 placing him in the 96th percentile.  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  Echocardiogram and exercise treadmill stress test were ordered.  Echo 11/02/2023 showed LVEF 55 to 60%, normal RV, no significant valvular abnormalities.  Exercise tolerance test 11/02/2023 was negative for ischemia, excellent exercise capacity with 13.4 METS achieved.  At preadmission testing appointment patient reported that his daughter had an anaphylactic reaction to cisatracurium when she had  surgery as an infant.  No other known family history of adverse reactions to anesthesia.  Patient has never had general anesthesia.  Patient did question whether or not he would be expected to have any negative reaction to nondepolarizing neuromuscular blocking agents.  I reviewed this with anesthesiologist Dr. Keneth.  He felt the likelihood of having any adverse reaction to rocuronium would be low and he anticipated that it could be used safely.  Preop labs reviewed, WNL.  EKG 09/25/2024: NSR.  Rate 77.  Exercise tolerance test 11/02/2023:   The ECG was negative for ischemia.   A Bruce protocol stress test was performed. Exercise capacity was excellent. Patient exercised for 12 min and 0 sec. Maximum HR of 155 bpm. MPHR 91.0%. Peak METS 13.4. The patient experienced no angina during the test. The patient achieved the target heart rate. The patient requested the test to be stopped. The patient reported no symptoms during the stress test. Normal blood pressure and normal heart rate response noted during stress. Heart rate recovery was normal.   Prior study not available for comparison.  TTE 11/02/2023: 1. Left ventricular ejection fraction, by estimation, is 55 to 60%. The  left ventricle has normal function. The left ventricle has no regional  wall motion abnormalities. Left ventricular diastolic parameters were  normal.   2. Right ventricular systolic function is normal. The right ventricular  size is normal.   3. The mitral valve is normal in structure. Trivial mitral valve  regurgitation. No evidence of mitral stenosis.   4. The aortic valve is tricuspid. There is mild calcification of the  aortic valve. Aortic valve regurgitation is not visualized. Aortic valve  sclerosis is present, with no evidence of aortic valve stenosis.   5. The inferior vena cava is normal in size with greater than 50%  respiratory variability, suggesting right atrial pressure of 3 mmHg.    )          Anesthesia Quick Evaluation

## 2024-09-26 NOTE — Progress Notes (Signed)
 Anesthesia Chart Review:  53 year old male with pertinent history including GERD on PPI, HLD, elevated coronary calcium score.  Patient was evaluated by cardiologist Dr. Michele 09/21/2023 at the request of PCP for elevated coronary artery calcium score of 317 placing him in the 96th percentile.  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  Echocardiogram and exercise treadmill stress test were ordered.  Echo 11/02/2023 showed LVEF 55 to 60%, normal RV, no significant valvular abnormalities.  Exercise tolerance test 11/02/2023 was negative for ischemia, excellent exercise capacity with 13.4 METS achieved.  At preadmission testing appointment patient reported that his daughter had an anaphylactic reaction to cisatracurium when she had surgery as an infant.  No other known family history of adverse reactions to anesthesia.  Patient has never had general anesthesia.  Patient did question whether or not he would be expected to have any negative reaction to nondepolarizing neuromuscular blocking agents.  I reviewed this with anesthesiologist Dr. Keneth.  He felt the likelihood of having any adverse reaction to rocuronium would be low and he anticipated that it could be used safely.  Preop labs reviewed, WNL.  EKG 09/25/2024: NSR.  Rate 77.  Exercise tolerance test 11/02/2023:   The ECG was negative for ischemia.   A Bruce protocol stress test was performed. Exercise capacity was excellent. Patient exercised for 12 min and 0 sec. Maximum HR of 155 bpm. MPHR 91.0%. Peak METS 13.4. The patient experienced no angina during the test. The patient achieved the target heart rate. The patient requested the test to be stopped. The patient reported no symptoms during the stress test. Normal blood pressure and normal heart rate response noted during stress. Heart rate recovery was normal.   Prior study not available for comparison.  TTE 11/02/2023: 1. Left  ventricular ejection fraction, by estimation, is 55 to 60%. The  left ventricle has normal function. The left ventricle has no regional  wall motion abnormalities. Left ventricular diastolic parameters were  normal.   2. Right ventricular systolic function is normal. The right ventricular  size is normal.   3. The mitral valve is normal in structure. Trivial mitral valve  regurgitation. No evidence of mitral stenosis.   4. The aortic valve is tricuspid. There is mild calcification of the  aortic valve. Aortic valve regurgitation is not visualized. Aortic valve  sclerosis is present, with no evidence of aortic valve stenosis.   5. The inferior vena cava is normal in size with greater than 50%  respiratory variability, suggesting right atrial pressure of 3 mmHg.     Lynwood Geofm RIGGERS [facility] Short Stay Center/Anesthesiology Phone [phone] 09/26/2024 1:50 PM

## 2024-09-28 ENCOUNTER — Encounter (HOSPITAL_COMMUNITY): Admission: RE | Disposition: A | Payer: Self-pay | Source: Home / Self Care | Attending: General Surgery

## 2024-09-28 ENCOUNTER — Ambulatory Visit (HOSPITAL_COMMUNITY)
Admission: RE | Admit: 2024-09-28 | Discharge: 2024-09-28 | Disposition: A | Discharge status: Home / Self Care | Payer: Self-pay | Attending: General Surgery | Admitting: General Surgery

## 2024-09-28 ENCOUNTER — Encounter (HOSPITAL_COMMUNITY): Payer: Self-pay | Admitting: General Surgery

## 2024-09-28 ENCOUNTER — Other Ambulatory Visit: Payer: Self-pay

## 2024-09-28 ENCOUNTER — Ambulatory Visit (HOSPITAL_COMMUNITY): Payer: Self-pay | Admitting: Physician Assistant

## 2024-09-28 ENCOUNTER — Ambulatory Visit (HOSPITAL_COMMUNITY): Admitting: Anesthesiology

## 2024-09-28 DIAGNOSIS — I2510 Atherosclerotic heart disease of native coronary artery without angina pectoris: Secondary | ICD-10-CM | POA: Insufficient documentation

## 2024-09-28 DIAGNOSIS — K219 Gastro-esophageal reflux disease without esophagitis: Secondary | ICD-10-CM | POA: Insufficient documentation

## 2024-09-28 DIAGNOSIS — E785 Hyperlipidemia, unspecified: Secondary | ICD-10-CM | POA: Insufficient documentation

## 2024-09-28 DIAGNOSIS — K4090 Unilateral inguinal hernia, without obstruction or gangrene, not specified as recurrent: Secondary | ICD-10-CM | POA: Insufficient documentation

## 2024-09-28 DIAGNOSIS — Z87891 Personal history of nicotine dependence: Secondary | ICD-10-CM | POA: Insufficient documentation

## 2024-09-28 HISTORY — PX: INGUINAL HERNIA REPAIR: SHX194

## 2024-09-28 SURGERY — REPAIR, HERNIA, INGUINAL, LAPAROSCOPIC
Anesthesia: General | Laterality: Right

## 2024-09-28 MED ORDER — ROCURONIUM BROMIDE 10 MG/ML (PF) SYRINGE
PREFILLED_SYRINGE | INTRAVENOUS | Status: DC | PRN
  Administered 2024-09-28: 50 mg via INTRAVENOUS

## 2024-09-28 MED ORDER — MEPERIDINE HCL 25 MG/ML IJ SOLN: 6.2500 mg | INTRAMUSCULAR | Status: DC | PRN

## 2024-09-28 MED ORDER — LACTATED RINGERS IV SOLN: INTRAVENOUS | Status: DC

## 2024-09-28 MED ORDER — HYDROMORPHONE HCL 1 MG/ML IJ SOLN: 0.2500 mg | INTRAMUSCULAR | Status: DC | PRN

## 2024-09-28 MED ORDER — LIDOCAINE 2% (20 MG/ML) 5 ML SYRINGE
INTRAMUSCULAR | Status: DC | PRN
  Administered 2024-09-28: 60 mg via INTRAVENOUS

## 2024-09-28 MED ORDER — BUPIVACAINE HCL (PF) 0.25 % IJ SOLN
INTRAMUSCULAR | Status: AC
  Filled 2024-09-28: qty 30

## 2024-09-28 MED ORDER — 0.9 % SODIUM CHLORIDE (POUR BTL) OPTIME
TOPICAL | Status: DC | PRN
Start: 2024-09-28 — End: 2024-09-28
  Administered 2024-09-28: 1000 mL

## 2024-09-28 MED ORDER — CEFAZOLIN SODIUM-DEXTROSE 2-4 GM/100ML-% IV SOLN
2.0000 g | INTRAVENOUS | Status: AC
  Administered 2024-09-28: 2 g via INTRAVENOUS
  Filled 2024-09-28: qty 100

## 2024-09-28 MED ORDER — PROPOFOL 10 MG/ML IV BOLUS
INTRAVENOUS | Status: AC
  Filled 2024-09-28: qty 20

## 2024-09-28 MED ORDER — MIDAZOLAM HCL 2 MG/2ML IJ SOLN
INTRAMUSCULAR | Status: AC
  Filled 2024-09-28: qty 2

## 2024-09-28 MED ORDER — BUPIVACAINE HCL 0.25 % IJ SOLN
INTRAMUSCULAR | Status: DC | PRN
  Administered 2024-09-28: 7 mL

## 2024-09-28 MED ORDER — MIDAZOLAM HCL (PF) 2 MG/2ML IJ SOLN: 0.5000 mg | Freq: Once | INTRAMUSCULAR | Status: DC | PRN

## 2024-09-28 MED ORDER — OXYCODONE HCL 5 MG PO TABS: 5.0000 mg | ORAL_TABLET | Freq: Once | ORAL | Status: DC | PRN

## 2024-09-28 MED ORDER — ONDANSETRON HCL 4 MG/2ML IJ SOLN
INTRAMUSCULAR | Status: DC | PRN
Start: 2024-09-28 — End: 2024-09-28
  Administered 2024-09-28: 4 mg via INTRAVENOUS

## 2024-09-28 MED ORDER — CHLORHEXIDINE GLUCONATE 0.12 % MT SOLN
15.0000 mL | Freq: Once | OROMUCOSAL | Status: AC
  Administered 2024-09-28: 15 mL via OROMUCOSAL
  Filled 2024-09-28: qty 15

## 2024-09-28 MED ORDER — PROPOFOL 10 MG/ML IV BOLUS
INTRAVENOUS | Status: DC | PRN
Start: 2024-09-28 — End: 2024-09-28
  Administered 2024-09-28: 200 mg via INTRAVENOUS

## 2024-09-28 MED ORDER — FENTANYL CITRATE (PF) 250 MCG/5ML IJ SOLN
INTRAMUSCULAR | Status: AC
  Filled 2024-09-28: qty 5

## 2024-09-28 MED ORDER — ENSURE PRE-SURGERY PO LIQD: 296.0000 mL | Freq: Once | ORAL | Status: DC

## 2024-09-28 MED ORDER — CHLORHEXIDINE GLUCONATE CLOTH 2 % EX PADS: 6.0000 | MEDICATED_PAD | Freq: Once | CUTANEOUS | Status: DC

## 2024-09-28 MED ORDER — TRAMADOL HCL 50 MG PO TABS: 50.0000 mg | ORAL_TABLET | Freq: Four times a day (QID) | ORAL | 0 refills | Status: AC | PRN

## 2024-09-28 MED ORDER — ORAL CARE MOUTH RINSE: 15.0000 mL | Freq: Once | OROMUCOSAL | Status: AC

## 2024-09-28 MED ORDER — SUGAMMADEX SODIUM 200 MG/2ML IV SOLN
INTRAVENOUS | Status: DC | PRN
Start: 2024-09-28 — End: 2024-09-28
  Administered 2024-09-28: 200 mg via INTRAVENOUS

## 2024-09-28 MED ORDER — FENTANYL CITRATE (PF) 250 MCG/5ML IJ SOLN
INTRAMUSCULAR | Status: DC | PRN
Start: 2024-09-28 — End: 2024-09-28
  Administered 2024-09-28: 100 ug via INTRAVENOUS

## 2024-09-28 MED ORDER — OXYCODONE HCL 5 MG/5ML PO SOLN: 5.0000 mg | Freq: Once | ORAL | Status: DC | PRN

## 2024-09-28 MED ORDER — DEXAMETHASONE SOD PHOSPHATE PF 10 MG/ML IJ SOLN
INTRAMUSCULAR | Status: DC | PRN
Start: 2024-09-28 — End: 2024-09-28
  Administered 2024-09-28: 10 mg via INTRAVENOUS

## 2024-09-28 MED ORDER — ACETAMINOPHEN 500 MG PO TABS
1000.0000 mg | ORAL_TABLET | ORAL | Status: AC
  Administered 2024-09-28: 1000 mg via ORAL
  Filled 2024-09-28: qty 2

## 2024-09-28 SURGICAL SUPPLY — 30 items
CANISTER SUCTION 3000ML PPV (SUCTIONS) IMPLANT
COVER SURGICAL LIGHT HANDLE (MISCELLANEOUS) ×1 IMPLANT
DERMABOND ADVANCED .7 DNX12 (GAUZE/BANDAGES/DRESSINGS) ×1 IMPLANT
DISSECTOR BLUNT TIP ENDO 5MM (MISCELLANEOUS) IMPLANT
ELECTRODE REM PT RTRN 9FT ADLT (ELECTROSURGICAL) ×1 IMPLANT
ENDOLOOP SUT PDS II 0 18 (SUTURE) IMPLANT
GLOVE BIO SURGEON STRL SZ7.5 (GLOVE) ×2 IMPLANT
GOWN STRL REUS W/ TWL LRG LVL3 (GOWN DISPOSABLE) ×2 IMPLANT
GOWN STRL REUS W/ TWL XL LVL3 (GOWN DISPOSABLE) ×1 IMPLANT
IRRIGATION SUCT STRKRFLW 2 WTP (MISCELLANEOUS) IMPLANT
KIT BASIN OR (CUSTOM PROCEDURE TRAY) ×1 IMPLANT
KIT TURNOVER KIT B (KITS) ×1 IMPLANT
MESH 3DMAX 5X7 RT XLRG (Mesh General) IMPLANT
NDL INSUFFLATION 14GA 120MM (NEEDLE) IMPLANT
NEEDLE INSUFFLATION 14GA 120MM (NEEDLE) IMPLANT
PAD ARMBOARD POSITIONER FOAM (MISCELLANEOUS) ×2 IMPLANT
RELOAD STAPLE 4.0 BLU F/HERNIA (INSTRUMENTS) IMPLANT
SCISSORS LAP 5X35 DISP (ENDOMECHANICALS) ×1 IMPLANT
SET TUBE SMOKE EVAC HIGH FLOW (TUBING) ×1 IMPLANT
SOLN 0.9% NACL POUR BTL 1000ML (IV SOLUTION) ×1 IMPLANT
SOLN STERILE WATER BTL 1000 ML (IV SOLUTION) ×1 IMPLANT
STAPLER HERNIA 12 8.5 360D (INSTRUMENTS) IMPLANT
SUT MNCRL AB 4-0 PS2 18 (SUTURE) ×1 IMPLANT
SUT VIC AB 1 CT1 27XBRD ANBCTR (SUTURE) IMPLANT
TOWEL GREEN STERILE FF (TOWEL DISPOSABLE) ×1 IMPLANT
TRAY LAPAROSCOPIC MC (CUSTOM PROCEDURE TRAY) ×1 IMPLANT
TROCAR OPTICAL SHORT 5MM (TROCAR) ×1 IMPLANT
TROCAR OPTICAL SLV SHORT 5MM (TROCAR) ×1 IMPLANT
TROCAR Z THREAD OPTICAL 12X100 (TROCAR) ×1 IMPLANT
WARMER LAPAROSCOPE (MISCELLANEOUS) ×1 IMPLANT

## 2024-09-28 NOTE — H&P (Signed)
 Chief Complaint: New Consultation ( ing hernia)       History of Present Illness: Donald Ramos is a 53 y.o. male who is seen today as an office consultation at the request of Dr. Charlott for evaluation of New Consultation ( ing hernia) .     History of Present Illness Donald Ramos is a 53 year old male who presents with a right-sided hernia.   He noticed a small bulge on his right side a few months ago, consistent with a hernia. He is active, running most days and practicing Brazilian jujitsu, and is concerned about the hernia's impact on these activities.   His brother had a hernia in his twenties and another recently, which was promptly treated. This family history raises his concern about the need for surgical intervention.   He has no prior abdominal surgeries. He is concerned about anesthesia due to a family history of anaphylactic reactions during surgery. His daughter had an anaphylactic reaction to anesthesia during a liver surgery as an infant. He plans to discuss the specific medications involved with his wife.     Review of Systems: A complete review of systems was obtained from the patient.  I have reviewed this information and discussed as appropriate with the patient.  See HPI as well for other ROS.   Review of Systems  Constitutional:  Negative for fever.  HENT:  Negative for congestion.   Eyes:  Negative for blurred vision.  Respiratory:  Negative for cough, shortness of breath and wheezing.   Cardiovascular:  Negative for chest pain and palpitations.  Gastrointestinal:  Negative for heartburn.  Genitourinary:  Negative for dysuria.  Musculoskeletal:  Negative for myalgias.  Skin:  Negative for rash.  Neurological:  Negative for dizziness and headaches.  Psychiatric/Behavioral:  Negative for depression and suicidal ideas.   All other systems reviewed and are negative.       Medical History:     Past Medical History:  Diagnosis Date   Anxiety      Calcification of native coronary artery     Diverticulosis large intestine w/o perforation or abscess w/o bleeding     ED (erectile dysfunction)     Family history of colonic polyps     Family history of pancreatic cancer     GERD (gastroesophageal reflux disease)     Hx of adenomatous colonic polyps     Paresthesia of left arm        There is no problem list on file for this patient.     History reviewed. No pertinent surgical history.        Allergies  Allergen Reactions   Penicillin Other (See Comments) and Nausea            Current Outpatient Medications on File Prior to Visit  Medication Sig Dispense Refill   aspirin 81 mg tablet Take 81 mg by mouth once daily       lansoprazole (PREVACID) 15 MG DR capsule Take 15 mg by mouth as needed       rosuvastatin (CRESTOR) 20 MG tablet TAKE 1 TABLET BY MOUTH EVERY DAY FOR 30 DAYS; Duration: 90       loratadine (CLARITIN) 10 mg tablet 1 tablet Orally Once a day (Patient not taking: Reported on 08/22/2024)        No current facility-administered medications on file prior to visit.           Family History  Problem Relation Age of Onset   Kidney disease  Mother     Other (Parkinson's disease) Father     Pancreatic cancer Maternal Grandfather     Hemangiomas Daughter     Pancreatic cancer Other          MGF's Brother   Pancreatic cancer Other          MGF's Brother      Social History        Tobacco Use  Smoking Status Former   Types: Cigarettes  Smokeless Tobacco Never      Social History         Socioeconomic History   Marital status: Married  Tobacco Use   Smoking status: Former      Types: Cigarettes   Smokeless tobacco: Never  Substance and Sexual Activity   Alcohol use: Never   Drug use: Never   Sexual activity: Defer    Social Drivers of Health        Housing Stability: Unknown (08/22/2024)    Housing Stability Vital Sign     Homeless in the Last Year: No      Objective:     BP (!) 121/92    Pulse 83   Temp 98.3 F (36.8 C) (Oral)   Resp 18   Ht 5' 10 (1.778 m)   Wt 72.6 kg   SpO2 99%   BMI 22.96 kg/m     Body mass index is 23.76 kg/m. Physical Exam Constitutional:      Appearance: Normal appearance.  HENT:     Head: Normocephalic and atraumatic.     Nose: Nose normal. No congestion.     Mouth/Throat:     Mouth: Mucous membranes are moist.     Pharynx: Oropharynx is clear.  Eyes:     Pupils: Pupils are equal, round, and reactive to light.  Cardiovascular:     Rate and Rhythm: Normal rate and regular rhythm.     Pulses: Normal pulses.     Heart sounds: Normal heart sounds. No murmur heard.    No friction rub. No gallop.  Pulmonary:     Effort: Pulmonary effort is normal. No respiratory distress.     Breath sounds: Normal breath sounds. No stridor. No wheezing, rhonchi or rales.  Abdominal:     General: Abdomen is flat.     Hernia: A hernia is present. Hernia is present in the right inguinal area. There is no hernia in the left inguinal area.  Musculoskeletal:        General: Normal range of motion.     Cervical back: Normal range of motion.  Skin:    General: Skin is warm and dry.  Neurological:     General: No focal deficit present.     Mental Status: He is alert and oriented to person, place, and time.  Psychiatric:        Mood and Affect: Mood normal.        Thought Content: Thought content normal.         Assessment and Plan:  Diagnoses and all orders for this visit:   Unilateral inguinal hernia without obstruction or gangrene, recurrence not specified     Donald Ramos is a 53 y.o. male     We will proceed to the OR for a LAP RIGHT inguinal hernia repair with mesh. All risks and benefits were discussed with the patient, to generally include infection, bleeding, damage to surrounding structures, acute and chronic nerve pain, and recurrence. Alternatives were offered and described.  All questions were  answered and the patient voiced  understanding of the procedure and wishes to proceed at this point.             No follow-ups on file.   Lynda Leos, MD, [facility], [place] General & Minimally Invasive Surgery

## 2024-09-28 NOTE — Anesthesia Procedure Notes (Signed)
 Procedure Name: Intubation Date/Time: 09/28/2024 12:08 PM  Performed by: Carolee Lauraine DASEN, CRNAPre-anesthesia Checklist: Patient identified, Emergency Drugs available, Suction available and Patient being monitored Patient Re-evaluated:Patient Re-evaluated prior to induction Oxygen Delivery Method: Circle System Utilized Preoxygenation: Pre-oxygenation with 100% oxygen Induction Type: IV induction Ventilation: Mask ventilation without difficulty Laryngoscope Size: Miller and 2 Grade View: Grade I Tube type: Oral Number of attempts: 1 Airway Equipment and Method: Stylet and Oral airway Placement Confirmation: ETT inserted through vocal cords under direct vision, positive ETCO2 and breath sounds checked- equal and bilateral Secured at: 21 cm Tube secured with: Tape Dental Injury: Teeth and Oropharynx as per pre-operative assessment

## 2024-09-28 NOTE — Discharge Instructions (Signed)

## 2024-09-28 NOTE — Op Note (Signed)
 09/28/2024  12:34 PM  PATIENT:  Donald Ramos  53 y.o. male  PRE-OPERATIVE DIAGNOSIS:  RIGHT INGUINAL HERNIA  POST-OPERATIVE DIAGNOSIS:  RIGHT INDIRECT INGUINAL HERNIA  PROCEDURE:  Procedure(s) with comments: REPAIR, HERNIA, INGUINAL, LAPAROSCOPIC (Right) - LAPAROSCOPIC RIGHT INGUINAL HERNIA REPAIR WITH MESH  SURGEON:  Surgeons and Role:    * Rubin Calamity, MD - Primary  ASSISTANTS: none   ANESTHESIA:   local and general  EBL:  minimal   BLOOD ADMINISTERED:none  DRAINS: none   LOCAL MEDICATIONS USED:  BUPIVICAINE   SPECIMEN:  No Specimen  DISPOSITION OF SPECIMEN:  N/A  COUNTS:  YES  TOURNIQUET:  * No tourniquets in log *  DICTATION: .Dragon Dictation  Counts: reported as correct x 2  Findings:  The patient had a medium right indirect hernia   Indications for procedure:  The patient is a 53 year old male with a right inguinal hernia for several months. Patient complained of symptomatology to his right inguinal area. The patient was taken back for elective inguinal hernia repair.  Details of the procedure: The patient was taken back to the operating room. The patient was placed in supine position with bilateral SCDs in place.  The patient was prepped and draped in the usual sterile fashion.  After appropriate anitbiotics were confirmed, a time-out was confirmed and all facts were verified.  0.25% Marcaine  was used to infiltrate the umbilical area. A 11-blade was used to cut down the skin and blunt dissection was used to get the anterior fashion.  The anterior fascia was incised approximately 1 cm and the muscles were retracted laterally. Blunt dissection was then used to create a space in the preperitoneal area. At this time a 10 mm camera was then introduced into the space and advanced the pubic tubercle and a 12 mm trocar was placed over this and insufflation was started.  At this time and space was created from medial to laterally the preperitoneal space.  Cooper's  ligament was initially cleaned off.  The hernia sac was identified in the indirect space. Dissection of the hernia sac and cord structures was undertaken the vas deferens was identified and protected in all parts of the case.   Once the hernia sac was taken down to approximately the umbilicus a Bard 3D Max mesh, size: Nickola, was  introduced into the preperitoneal space.  The mesh was brought over to cover the direct and indirect hernia spaces.  This was anchored into place and secured to Cooper's ligament with 4.73mm staples from a Coviden hernia stapler. It was anchored to the anterior abdominal wall with 4.8 mm staples. The hernia sac was seen lying posterior to the mesh. There was no staples placed laterally. The insufflation was evacuated and the peritoneum was seen posterior to the mesh. The trochars were removed. The anterior fascia was reapproximated using #1 Vicryl on a UR- 6.  Intra-abdominal air was evacuated and the Veress needle removed. The skin was reapproximated using 4-0 Monocryl subcuticular fashion and Dermabond. The patient was awakened from general anesthesia and taken to recovery in stable condition.   PLAN OF CARE: Discharge to home after PACU  PATIENT DISPOSITION:  PACU - hemodynamically stable.   Delay start of Pharmacological VTE agent (>24hrs) due to surgical blood loss or risk of bleeding: not applicable

## 2024-09-28 NOTE — Transfer of Care (Signed)
 Immediate Anesthesia Transfer of Care Note  Patient: [name]  Procedure(s) Performed: REPAIR, HERNIA, INGUINAL, LAPAROSCOPIC (Right)  Patient Location: PACU  Anesthesia Type:General  Level of Consciousness: awake, alert , and oriented  Airway & Oxygen Therapy: Patient Spontanous Breathing and Patient connected to nasal cannula oxygen  Post-op Assessment: Report given to RN, Post -op Vital signs reviewed and stable, and Patient moving all extremities X 4  Post vital signs: Reviewed and stable  Last Vitals:  Vitals Value Taken Time  BP 112/78 09/28/24 12:48  Temp    Pulse 75 09/28/24 12:50  Resp 17 09/28/24 12:50  SpO2 99 % 09/28/24 12:50  Vitals shown include unfiled device data.  Last Pain:  Vitals:   09/28/24 1044  TempSrc:   PainSc: 0-No pain      Patients Stated Pain Goal: 0 (09/28/24 1034)  Complications: No notable events documented.

## 2024-09-29 ENCOUNTER — Encounter (HOSPITAL_COMMUNITY): Payer: Self-pay | Admitting: General Surgery

## 2024-09-29 NOTE — Anesthesia Postprocedure Evaluation (Signed)
 Anesthesia Post Note  Patient: Donald Ramos  Procedure(s) Performed: REPAIR, HERNIA, INGUINAL, LAPAROSCOPIC (Right)     Patient location during evaluation: PACU Anesthesia Type: General Level of consciousness: awake and alert Pain management: pain level controlled Vital Signs Assessment: post-procedure vital signs reviewed and stable Respiratory status: spontaneous breathing, nonlabored ventilation, respiratory function stable and patient connected to nasal cannula oxygen Cardiovascular status: blood pressure returned to baseline and stable Postop Assessment: no apparent nausea or vomiting Anesthetic complications: no   No notable events documented.  Last Vitals:  Vitals:   09/28/24 1338 09/28/24 1339  BP: 107/71   Pulse: 75 73  Resp: 15 15  Temp:  37 C  SpO2: 97% 97%    Last Pain:  Vitals:   09/28/24 1339  TempSrc:   PainSc: 0-No pain                 Monaca Wadas S
